# Patient Record
Sex: Female | Born: 1980 | Hispanic: No | Marital: Married | Smoking: Never smoker
Health system: Southern US, Community
[De-identification: ages and names within clinical notes are randomized; demographics above are authoritative.]

## PROBLEM LIST (undated history)

## (undated) DIAGNOSIS — Z5189 Encounter for other specified aftercare: Secondary | ICD-10-CM

## (undated) DIAGNOSIS — Z8759 Personal history of other complications of pregnancy, childbirth and the puerperium: Secondary | ICD-10-CM

## (undated) HISTORY — DX: Encounter for other specified aftercare: Z51.89

## (undated) HISTORY — DX: Personal history of other complications of pregnancy, childbirth and the puerperium: Z87.59

---

## 2016-08-16 DIAGNOSIS — N898 Other specified noninflammatory disorders of vagina: Secondary | ICD-10-CM | POA: Diagnosis not present

## 2016-08-16 DIAGNOSIS — B373 Candidiasis of vulva and vagina: Secondary | ICD-10-CM | POA: Diagnosis not present

## 2016-08-16 DIAGNOSIS — N920 Excessive and frequent menstruation with regular cycle: Secondary | ICD-10-CM | POA: Diagnosis not present

## 2017-02-08 DIAGNOSIS — Z6821 Body mass index (BMI) 21.0-21.9, adult: Secondary | ICD-10-CM | POA: Diagnosis not present

## 2017-02-08 DIAGNOSIS — Z130 Encounter for screening for diseases of the blood and blood-forming organs and certain disorders involving the immune mechanism: Secondary | ICD-10-CM | POA: Diagnosis not present

## 2017-02-08 DIAGNOSIS — Z1329 Encounter for screening for other suspected endocrine disorder: Secondary | ICD-10-CM | POA: Diagnosis not present

## 2017-02-08 DIAGNOSIS — Z0000 Encounter for general adult medical examination without abnormal findings: Secondary | ICD-10-CM | POA: Diagnosis not present

## 2017-02-08 DIAGNOSIS — Z1151 Encounter for screening for human papillomavirus (HPV): Secondary | ICD-10-CM | POA: Diagnosis not present

## 2017-02-08 DIAGNOSIS — Z13220 Encounter for screening for lipoid disorders: Secondary | ICD-10-CM | POA: Diagnosis not present

## 2017-02-08 DIAGNOSIS — Z01419 Encounter for gynecological examination (general) (routine) without abnormal findings: Secondary | ICD-10-CM | POA: Diagnosis not present

## 2017-02-16 DIAGNOSIS — R1013 Epigastric pain: Secondary | ICD-10-CM | POA: Diagnosis not present

## 2017-02-21 DIAGNOSIS — R1013 Epigastric pain: Secondary | ICD-10-CM | POA: Diagnosis not present

## 2017-02-28 ENCOUNTER — Other Ambulatory Visit (HOSPITAL_COMMUNITY): Payer: Self-pay | Admitting: Gastroenterology

## 2017-02-28 DIAGNOSIS — R1013 Epigastric pain: Secondary | ICD-10-CM

## 2017-03-07 DIAGNOSIS — Z30430 Encounter for insertion of intrauterine contraceptive device: Secondary | ICD-10-CM | POA: Diagnosis not present

## 2017-03-14 ENCOUNTER — Ambulatory Visit (HOSPITAL_COMMUNITY)
Admission: RE | Admit: 2017-03-14 | Discharge: 2017-03-14 | Disposition: A | Discharge status: Home / Self Care | Payer: BLUE CROSS/BLUE SHIELD | Source: Ambulatory Visit | Attending: Gastroenterology | Admitting: Gastroenterology

## 2017-03-14 DIAGNOSIS — R1013 Epigastric pain: Secondary | ICD-10-CM

## 2017-03-14 DIAGNOSIS — R1011 Right upper quadrant pain: Secondary | ICD-10-CM | POA: Diagnosis not present

## 2017-03-14 MED ORDER — TECHNETIUM TC 99M MEBROFENIN IV KIT
5.0000 | PACK | Freq: Once | INTRAVENOUS | Status: AC | PRN
  Administered 2017-03-14: 5 via INTRAVENOUS

## 2017-05-02 DIAGNOSIS — N920 Excessive and frequent menstruation with regular cycle: Secondary | ICD-10-CM | POA: Diagnosis not present

## 2017-05-02 DIAGNOSIS — R1031 Right lower quadrant pain: Secondary | ICD-10-CM | POA: Diagnosis not present

## 2017-05-02 DIAGNOSIS — Z30431 Encounter for routine checking of intrauterine contraceptive device: Secondary | ICD-10-CM | POA: Diagnosis not present

## 2018-02-13 DIAGNOSIS — Z01419 Encounter for gynecological examination (general) (routine) without abnormal findings: Secondary | ICD-10-CM | POA: Diagnosis not present

## 2018-02-13 DIAGNOSIS — Z6822 Body mass index (BMI) 22.0-22.9, adult: Secondary | ICD-10-CM | POA: Diagnosis not present

## 2018-02-13 DIAGNOSIS — Z130 Encounter for screening for diseases of the blood and blood-forming organs and certain disorders involving the immune mechanism: Secondary | ICD-10-CM | POA: Diagnosis not present

## 2018-06-14 DIAGNOSIS — H16203 Unspecified keratoconjunctivitis, bilateral: Secondary | ICD-10-CM | POA: Diagnosis not present

## 2018-07-16 DIAGNOSIS — H16203 Unspecified keratoconjunctivitis, bilateral: Secondary | ICD-10-CM | POA: Diagnosis not present

## 2018-10-17 DIAGNOSIS — H16203 Unspecified keratoconjunctivitis, bilateral: Secondary | ICD-10-CM | POA: Diagnosis not present

## 2019-04-03 IMAGING — NM NM HEPATO W/GB/PHARM/[PERSON_NAME]
3 series · 13 of 13 positions shown · non-contrast
Comparison: None.

CLINICAL DATA: Right upper quadrant pain and bloating for 1 month

EXAM:
NUCLEAR MEDICINE HEPATOBILIARY IMAGING WITH GALLBLADDER EF
TECHNIQUE: Sequential images of the abdomen were obtained [DATE] minutes
following intravenous administration of radiopharmaceutical. After
oral ingestion of Ensure, gallbladder ejection fraction was
determined. At 60 min, normal ejection fraction is greater than 33%.
RADIOPHARMACEUTICALS:  5.15 mCi Kc-JJm  Choletec IV

[he hepatobiliary · 3.43mm/px · 6 of 60 frames shown (1 of 2)]
[frame 6/60]
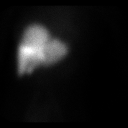
[frame 16/60]
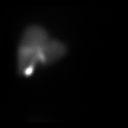
[frame 26/60]
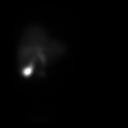
[frame 36/60]
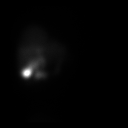
[frame 46/60]
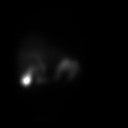
[frame 56/60]
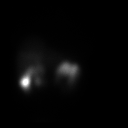

[he hepatobiliary · 3.43mm/px · 6 of 60 frames shown (2 of 2)]
[frame 6/60]
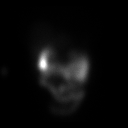
[frame 16/60]
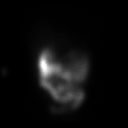
[frame 26/60]
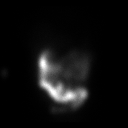
[frame 36/60]
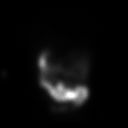
[frame 46/60]
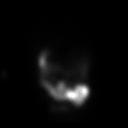
[frame 56/60]
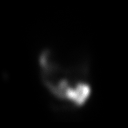

[st static image · 1.72mm/px · 1 of 1 slices shown]
[im 1/1]
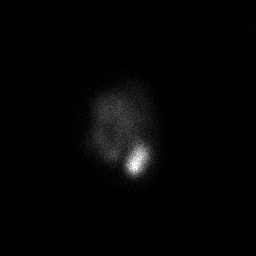

[13 of 13 positions shown; findings below may reference images not displayed]

FINDINGS: Prompt uptake and biliary excretion of activity by the liver is
seen. Gallbladder activity is visualized, consistent with patency of
cystic duct. Biliary activity passes into small bowel, consistent
with patent common bile duct.

Calculated gallbladder ejection fraction is 64%. (Normal gallbladder
ejection fraction with Ensure is greater than 33%.)
IMPRESSION: Normal uptake and excretion of biliary tracer.

Normal gallbladder ejection fraction.

## 2019-10-31 DIAGNOSIS — Z01419 Encounter for gynecological examination (general) (routine) without abnormal findings: Secondary | ICD-10-CM | POA: Diagnosis not present

## 2019-10-31 DIAGNOSIS — Z1151 Encounter for screening for human papillomavirus (HPV): Secondary | ICD-10-CM | POA: Diagnosis not present

## 2019-10-31 DIAGNOSIS — Z30432 Encounter for removal of intrauterine contraceptive device: Secondary | ICD-10-CM | POA: Diagnosis not present

## 2019-10-31 DIAGNOSIS — Z6823 Body mass index (BMI) 23.0-23.9, adult: Secondary | ICD-10-CM | POA: Diagnosis not present

## 2019-11-17 ENCOUNTER — Ambulatory Visit: Payer: BC Managed Care – PPO | Attending: Internal Medicine

## 2019-12-10 DIAGNOSIS — Z131 Encounter for screening for diabetes mellitus: Secondary | ICD-10-CM | POA: Diagnosis not present

## 2019-12-10 DIAGNOSIS — Z1329 Encounter for screening for other suspected endocrine disorder: Secondary | ICD-10-CM | POA: Diagnosis not present

## 2019-12-10 DIAGNOSIS — Z13220 Encounter for screening for lipoid disorders: Secondary | ICD-10-CM | POA: Diagnosis not present

## 2019-12-10 DIAGNOSIS — Z1321 Encounter for screening for nutritional disorder: Secondary | ICD-10-CM | POA: Diagnosis not present

## 2019-12-10 DIAGNOSIS — Z0000 Encounter for general adult medical examination without abnormal findings: Secondary | ICD-10-CM | POA: Diagnosis not present

## 2020-01-08 DIAGNOSIS — R7401 Elevation of levels of liver transaminase levels: Secondary | ICD-10-CM | POA: Diagnosis not present

## 2020-01-08 DIAGNOSIS — N926 Irregular menstruation, unspecified: Secondary | ICD-10-CM | POA: Diagnosis not present

## 2020-01-16 DIAGNOSIS — Z3200 Encounter for pregnancy test, result unknown: Secondary | ICD-10-CM | POA: Diagnosis not present

## 2020-01-16 DIAGNOSIS — N911 Secondary amenorrhea: Secondary | ICD-10-CM | POA: Diagnosis not present

## 2020-02-13 DIAGNOSIS — N911 Secondary amenorrhea: Secondary | ICD-10-CM | POA: Diagnosis not present

## 2020-03-16 DIAGNOSIS — R945 Abnormal results of liver function studies: Secondary | ICD-10-CM | POA: Diagnosis not present

## 2020-03-24 DIAGNOSIS — N911 Secondary amenorrhea: Secondary | ICD-10-CM | POA: Diagnosis not present

## 2020-08-11 LAB — OB RESULTS CONSOLE GC/CHLAMYDIA
Chlamydia: NEGATIVE
Gonorrhea: NEGATIVE

## 2020-08-11 LAB — OB RESULTS CONSOLE RUBELLA ANTIBODY, IGM: Rubella: IMMUNE

## 2020-08-11 LAB — OB RESULTS CONSOLE RPR: RPR: NONREACTIVE

## 2020-08-11 LAB — OB RESULTS CONSOLE HEPATITIS B SURFACE ANTIGEN: Hepatitis B Surface Ag: NEGATIVE

## 2020-08-11 LAB — OB RESULTS CONSOLE HIV ANTIBODY (ROUTINE TESTING): HIV: NONREACTIVE

## 2021-01-01 ENCOUNTER — Other Ambulatory Visit: Payer: Self-pay

## 2021-01-01 ENCOUNTER — Inpatient Hospital Stay (HOSPITAL_COMMUNITY)
Admission: AD | Admit: 2021-01-01 | Discharge: 2021-01-01 | Disposition: A | Discharge status: Home / Self Care | Payer: PRIVATE HEALTH INSURANCE | Attending: Obstetrics | Admitting: Obstetrics

## 2021-01-01 ENCOUNTER — Inpatient Hospital Stay (HOSPITAL_BASED_OUTPATIENT_CLINIC_OR_DEPARTMENT_OTHER): Payer: PRIVATE HEALTH INSURANCE

## 2021-01-01 ENCOUNTER — Encounter (HOSPITAL_COMMUNITY): Payer: Self-pay | Admitting: Obstetrics

## 2021-01-01 DIAGNOSIS — R109 Unspecified abdominal pain: Secondary | ICD-10-CM | POA: Diagnosis not present

## 2021-01-01 DIAGNOSIS — O99891 Other specified diseases and conditions complicating pregnancy: Secondary | ICD-10-CM | POA: Diagnosis not present

## 2021-01-01 DIAGNOSIS — Z3689 Encounter for other specified antenatal screening: Secondary | ICD-10-CM | POA: Diagnosis not present

## 2021-01-01 DIAGNOSIS — O26893 Other specified pregnancy related conditions, third trimester: Secondary | ICD-10-CM | POA: Diagnosis present

## 2021-01-01 DIAGNOSIS — Z3A30 30 weeks gestation of pregnancy: Secondary | ICD-10-CM | POA: Diagnosis not present
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
## 2021-01-02 DIAGNOSIS — T1490XA Injury, unspecified, initial encounter: Secondary | ICD-10-CM

## 2021-01-02 DIAGNOSIS — Z3A30 30 weeks gestation of pregnancy: Secondary | ICD-10-CM | POA: Diagnosis not present

## 2021-01-02 DIAGNOSIS — O9A213 Injury, poisoning and certain other consequences of external causes complicating pregnancy, third trimester: Secondary | ICD-10-CM | POA: Diagnosis not present

## 2021-02-09 LAB — OB RESULTS CONSOLE GBS: GBS: NEGATIVE

## 2021-02-12 ENCOUNTER — Telehealth (HOSPITAL_COMMUNITY): Payer: Self-pay | Admitting: *Deleted

## 2021-02-12 NOTE — Telephone Encounter (Signed)
Preadmission screen  

## 2021-02-15 ENCOUNTER — Telehealth (HOSPITAL_COMMUNITY): Payer: Self-pay | Admitting: *Deleted

## 2021-02-15 NOTE — Telephone Encounter (Signed)
Preadmission screen  

## 2021-02-17 ENCOUNTER — Telehealth (HOSPITAL_COMMUNITY): Payer: Self-pay | Admitting: *Deleted

## 2021-02-17 ENCOUNTER — Encounter (HOSPITAL_COMMUNITY): Payer: Self-pay | Admitting: *Deleted

## 2021-02-17 NOTE — Telephone Encounter (Signed)
Preadmission screen  

## 2021-02-25 ENCOUNTER — Other Ambulatory Visit (HOSPITAL_COMMUNITY)
Admission: RE | Admit: 2021-02-25 | Discharge: 2021-02-25 | Disposition: A | Discharge status: Home / Self Care | Payer: No Typology Code available for payment source | Source: Ambulatory Visit | Attending: Obstetrics & Gynecology | Admitting: Obstetrics & Gynecology

## 2021-02-25 ENCOUNTER — Other Ambulatory Visit: Payer: Self-pay | Admitting: Obstetrics & Gynecology

## 2021-02-25 DIAGNOSIS — Z01812 Encounter for preprocedural laboratory examination: Secondary | ICD-10-CM | POA: Insufficient documentation

## 2021-02-25 DIAGNOSIS — Z20822 Contact with and (suspected) exposure to covid-19: Secondary | ICD-10-CM | POA: Insufficient documentation

## 2021-02-26 LAB — SARS CORONAVIRUS 2 (TAT 6-24 HRS): SARS Coronavirus 2: NEGATIVE

## 2021-02-27 ENCOUNTER — Encounter (HOSPITAL_COMMUNITY)
Admission: AD | Disposition: A | Discharge status: Home / Self Care | Payer: Self-pay | Source: Home / Self Care | Attending: Obstetrics & Gynecology

## 2021-02-27 ENCOUNTER — Inpatient Hospital Stay (HOSPITAL_COMMUNITY): Payer: No Typology Code available for payment source | Admitting: Anesthesiology

## 2021-02-27 ENCOUNTER — Encounter (HOSPITAL_COMMUNITY): Payer: Self-pay | Admitting: Obstetrics & Gynecology

## 2021-02-27 ENCOUNTER — Inpatient Hospital Stay (HOSPITAL_COMMUNITY)
Admission: AD | Admit: 2021-02-27 | Discharge: 2021-03-01 | DRG: 787 | Disposition: A | Discharge status: Home / Self Care | Payer: No Typology Code available for payment source | Attending: Obstetrics & Gynecology | Admitting: Obstetrics & Gynecology

## 2021-02-27 ENCOUNTER — Inpatient Hospital Stay (HOSPITAL_COMMUNITY): Payer: No Typology Code available for payment source

## 2021-02-27 ENCOUNTER — Other Ambulatory Visit: Payer: Self-pay

## 2021-02-27 DIAGNOSIS — O24420 Gestational diabetes mellitus in childbirth, diet controlled: Principal | ICD-10-CM | POA: Diagnosis present

## 2021-02-27 DIAGNOSIS — O9902 Anemia complicating childbirth: Secondary | ICD-10-CM | POA: Diagnosis present

## 2021-02-27 DIAGNOSIS — O9912 Other diseases of the blood and blood-forming organs and certain disorders involving the immune mechanism complicating childbirth: Secondary | ICD-10-CM | POA: Diagnosis present

## 2021-02-27 DIAGNOSIS — Z3A39 39 weeks gestation of pregnancy: Secondary | ICD-10-CM

## 2021-02-27 DIAGNOSIS — Z20822 Contact with and (suspected) exposure to covid-19: Secondary | ICD-10-CM | POA: Diagnosis present

## 2021-02-27 DIAGNOSIS — D6959 Other secondary thrombocytopenia: Secondary | ICD-10-CM | POA: Diagnosis present

## 2021-02-27 DIAGNOSIS — Z3490 Encounter for supervision of normal pregnancy, unspecified, unspecified trimester: Secondary | ICD-10-CM | POA: Diagnosis present

## 2021-02-27 DIAGNOSIS — D649 Anemia, unspecified: Secondary | ICD-10-CM

## 2021-02-27 DIAGNOSIS — O324XX0 Maternal care for high head at term, not applicable or unspecified: Secondary | ICD-10-CM | POA: Diagnosis present

## 2021-02-27 DIAGNOSIS — O24410 Gestational diabetes mellitus in pregnancy, diet controlled: Secondary | ICD-10-CM | POA: Diagnosis present

## 2021-02-27 DIAGNOSIS — O99892 Other specified diseases and conditions complicating childbirth: Secondary | ICD-10-CM | POA: Diagnosis present

## 2021-02-27 DIAGNOSIS — O322XX0 Maternal care for transverse and oblique lie, not applicable or unspecified: Secondary | ICD-10-CM | POA: Diagnosis present

## 2021-02-27 DIAGNOSIS — O99119 Other diseases of the blood and blood-forming organs and certain disorders involving the immune mechanism complicating pregnancy, unspecified trimester: Secondary | ICD-10-CM | POA: Diagnosis present

## 2021-02-27 DIAGNOSIS — D696 Thrombocytopenia, unspecified: Secondary | ICD-10-CM | POA: Diagnosis present

## 2021-02-27 LAB — CBC
HCT: 40.4 % (ref 36.0–46.0)
Hemoglobin: 13.2 g/dL (ref 12.0–15.0)
MCH: 28.3 pg (ref 26.0–34.0)
MCHC: 32.7 g/dL (ref 30.0–36.0)
MCV: 86.7 fL (ref 80.0–100.0)
Platelets: 129 10*3/uL — ABNORMAL LOW (ref 150–400)
RBC: 4.66 MIL/uL (ref 3.87–5.11)
RDW: 13.4 % (ref 11.5–15.5)
WBC: 8.2 10*3/uL (ref 4.0–10.5)
nRBC: 0 % (ref 0.0–0.2)

## 2021-02-27 LAB — GLUCOSE, CAPILLARY
Glucose-Capillary: 124 mg/dL — ABNORMAL HIGH (ref 70–99)
Glucose-Capillary: 114 mg/dL — ABNORMAL HIGH (ref 70–99)
Glucose-Capillary: 96 mg/dL (ref 70–99)
Glucose-Capillary: 91 mg/dL (ref 70–99)
Glucose-Capillary: 97 mg/dL (ref 70–99)

## 2021-02-27 LAB — TYPE AND SCREEN
ABO/RH(D): A POS
Antibody Screen: NEGATIVE

## 2021-02-27 LAB — RPR: RPR Ser Ql: NONREACTIVE

## 2021-02-27 SURGERY — Surgical Case
Anesthesia: Epidural

## 2021-02-27 MED ORDER — MISOPROSTOL 25 MCG QUARTER TABLET: 25.0000 ug | ORAL_TABLET | Freq: Once | ORAL | Status: DC

## 2021-02-27 MED ORDER — SOD CITRATE-CITRIC ACID 500-334 MG/5ML PO SOLN
30.0000 mL | ORAL | Status: DC | PRN
  Administered 2021-02-27: 30 mL via ORAL
  Filled 2021-02-27: qty 15

## 2021-02-27 MED ORDER — LACTATED RINGERS IV SOLN: INTRAVENOUS | Status: DC

## 2021-02-27 MED ORDER — CEFAZOLIN SODIUM-DEXTROSE 2-4 GM/100ML-% IV SOLN
2.0000 g | INTRAVENOUS | Status: AC
  Administered 2021-02-27: 2 g via INTRAVENOUS

## 2021-02-27 MED ORDER — FENTANYL CITRATE (PF) 100 MCG/2ML IJ SOLN
INTRAMUSCULAR | Status: AC
  Filled 2021-02-27: qty 2

## 2021-02-27 MED ORDER — PHENYLEPHRINE HCL (PRESSORS) 10 MG/ML IV SOLN
INTRAVENOUS | Status: DC | PRN
  Administered 2021-02-27: 120 ug via INTRAVENOUS
  Administered 2021-02-27 (×2): 80 ug via INTRAVENOUS

## 2021-02-27 MED ORDER — POVIDONE-IODINE 10 % EX SWAB: 2.0000 "application " | Freq: Once | CUTANEOUS | Status: DC

## 2021-02-27 MED ORDER — OXYTOCIN-SODIUM CHLORIDE 30-0.9 UT/500ML-% IV SOLN: 2.5000 [IU]/h | INTRAVENOUS | Status: DC

## 2021-02-27 MED ORDER — DIPHENHYDRAMINE HCL 50 MG/ML IJ SOLN: 12.5000 mg | INTRAMUSCULAR | Status: DC | PRN

## 2021-02-27 MED ORDER — CEFAZOLIN SODIUM-DEXTROSE 2-4 GM/100ML-% IV SOLN
INTRAVENOUS | Status: AC
  Filled 2021-02-27: qty 100

## 2021-02-27 MED ORDER — ONDANSETRON HCL 4 MG/2ML IJ SOLN: 4.0000 mg | Freq: Four times a day (QID) | INTRAMUSCULAR | Status: DC | PRN

## 2021-02-27 MED ORDER — LACTATED RINGERS IV SOLN
500.0000 mL | INTRAVENOUS | Status: DC | PRN
  Administered 2021-02-27: 500 mL via INTRAVENOUS

## 2021-02-27 MED ORDER — DEXAMETHASONE SODIUM PHOSPHATE 4 MG/ML IJ SOLN
INTRAMUSCULAR | Status: DC | PRN
  Administered 2021-02-27: 4 mg via INTRAVENOUS

## 2021-02-27 MED ORDER — ONDANSETRON HCL 4 MG/2ML IJ SOLN
INTRAMUSCULAR | Status: AC
  Filled 2021-02-27: qty 2

## 2021-02-27 MED ORDER — EPHEDRINE 5 MG/ML INJ: 10.0000 mg | INTRAVENOUS | Status: DC | PRN

## 2021-02-27 MED ORDER — EPHEDRINE 5 MG/ML INJ
10.0000 mg | INTRAVENOUS | Status: DC | PRN
  Administered 2021-02-27: 10 mg via INTRAVENOUS
  Filled 2021-02-27: qty 10

## 2021-02-27 MED ORDER — MORPHINE SULFATE (PF) 0.5 MG/ML IJ SOLN
INTRAMUSCULAR | Status: AC
  Filled 2021-02-27: qty 10

## 2021-02-27 MED ORDER — OXYTOCIN-SODIUM CHLORIDE 30-0.9 UT/500ML-% IV SOLN
1.0000 m[IU]/min | INTRAVENOUS | Status: DC
  Administered 2021-02-27: 2 m[IU]/min via INTRAVENOUS
  Filled 2021-02-27: qty 500

## 2021-02-27 MED ORDER — MISOPROSTOL 50MCG HALF TABLET
50.0000 ug | ORAL_TABLET | Freq: Once | ORAL | Status: AC
  Administered 2021-02-27: 50 ug via BUCCAL
  Filled 2021-02-27: qty 1

## 2021-02-27 MED ORDER — FENTANYL CITRATE (PF) 100 MCG/2ML IJ SOLN
INTRAMUSCULAR | Status: DC | PRN
  Administered 2021-02-27: 100 ug via EPIDURAL

## 2021-02-27 MED ORDER — ONDANSETRON HCL 4 MG/2ML IJ SOLN
INTRAMUSCULAR | Status: DC | PRN
  Administered 2021-02-27: 4 mg via INTRAVENOUS

## 2021-02-27 MED ORDER — OXYTOCIN BOLUS FROM INFUSION: 333.0000 mL | Freq: Once | INTRAVENOUS | Status: DC

## 2021-02-27 MED ORDER — PHENYLEPHRINE 40 MCG/ML (10ML) SYRINGE FOR IV PUSH (FOR BLOOD PRESSURE SUPPORT)
80.0000 ug | PREFILLED_SYRINGE | INTRAVENOUS | Status: DC | PRN
  Filled 2021-02-27: qty 10

## 2021-02-27 MED ORDER — MEPERIDINE HCL 25 MG/ML IJ SOLN
INTRAMUSCULAR | Status: AC
  Filled 2021-02-27: qty 1

## 2021-02-27 MED ORDER — LACTATED RINGERS IV SOLN
500.0000 mL | Freq: Once | INTRAVENOUS | Status: AC
  Administered 2021-02-27: 500 mL via INTRAVENOUS

## 2021-02-27 MED ORDER — ACETAMINOPHEN 325 MG PO TABS: 650.0000 mg | ORAL_TABLET | ORAL | Status: DC | PRN

## 2021-02-27 MED ORDER — FENTANYL-BUPIVACAINE-NACL 0.5-0.125-0.9 MG/250ML-% EP SOLN
12.0000 mL/h | EPIDURAL | Status: DC | PRN
Start: 2021-02-27 — End: 2021-02-28
  Administered 2021-02-27: 12 mL/h via EPIDURAL
  Filled 2021-02-27: qty 250

## 2021-02-27 MED ORDER — PHENYLEPHRINE 40 MCG/ML (10ML) SYRINGE FOR IV PUSH (FOR BLOOD PRESSURE SUPPORT): 80.0000 ug | PREFILLED_SYRINGE | INTRAVENOUS | Status: DC | PRN

## 2021-02-27 MED ORDER — TERBUTALINE SULFATE 1 MG/ML IJ SOLN: 0.2500 mg | Freq: Once | INTRAMUSCULAR | Status: DC | PRN

## 2021-02-27 MED ORDER — DEXAMETHASONE SODIUM PHOSPHATE 4 MG/ML IJ SOLN
INTRAMUSCULAR | Status: AC
  Filled 2021-02-27: qty 1

## 2021-02-27 MED ORDER — LIDOCAINE-EPINEPHRINE (PF) 2 %-1:200000 IJ SOLN
INTRAMUSCULAR | Status: DC | PRN
  Administered 2021-02-27: 10 mL via EPIDURAL
  Administered 2021-02-27: 4 mL via EPIDURAL

## 2021-02-27 MED ORDER — LIDOCAINE HCL (PF) 1 % IJ SOLN: 30.0000 mL | INTRAMUSCULAR | Status: DC | PRN

## 2021-02-27 MED ORDER — OXYTOCIN-SODIUM CHLORIDE 30-0.9 UT/500ML-% IV SOLN
INTRAVENOUS | Status: AC
  Filled 2021-02-27: qty 500

## 2021-02-27 MED ORDER — OXYTOCIN-SODIUM CHLORIDE 30-0.9 UT/500ML-% IV SOLN
INTRAVENOUS | Status: DC | PRN
  Administered 2021-02-27 – 2021-02-28 (×2): 30 [IU] via INTRAVENOUS

## 2021-02-27 SURGICAL SUPPLY — 34 items
BENZOIN TINCTURE PRP APPL 2/3 (GAUZE/BANDAGES/DRESSINGS) ×2 IMPLANT
CHLORAPREP W/TINT 26ML (MISCELLANEOUS) ×2 IMPLANT
CLAMP CORD UMBIL (MISCELLANEOUS) IMPLANT
CLOTH BEACON ORANGE TIMEOUT ST (SAFETY) ×2 IMPLANT
DRSG OPSITE POSTOP 4X10 (GAUZE/BANDAGES/DRESSINGS) ×2 IMPLANT
ELECT REM PT RETURN 9FT ADLT (ELECTROSURGICAL) ×2
ELECTRODE REM PT RTRN 9FT ADLT (ELECTROSURGICAL) ×1 IMPLANT
EXTRACTOR VACUUM KIWI (MISCELLANEOUS) IMPLANT
EXTRACTOR VACUUM M CUP 4 TUBE (SUCTIONS) IMPLANT
GLOVE BIO SURGEON STRL SZ7 (GLOVE) ×2 IMPLANT
GLOVE BIOGEL PI IND STRL 7.0 (GLOVE) ×2 IMPLANT
GLOVE BIOGEL PI INDICATOR 7.0 (GLOVE) ×2
GOWN STRL REUS W/TWL LRG LVL3 (GOWN DISPOSABLE) ×4 IMPLANT
KIT ABG SYR 3ML LUER SLIP (SYRINGE) IMPLANT
NEEDLE HYPO 25X5/8 SAFETYGLIDE (NEEDLE) IMPLANT
NS IRRIG 1000ML POUR BTL (IV SOLUTION) ×2 IMPLANT
PACK C SECTION WH (CUSTOM PROCEDURE TRAY) ×2 IMPLANT
PAD OB MATERNITY 4.3X12.25 (PERSONAL CARE ITEMS) ×2 IMPLANT
RTRCTR C-SECT PINK 25CM LRG (MISCELLANEOUS) IMPLANT
STRIP CLOSURE SKIN 1/2X4 (GAUZE/BANDAGES/DRESSINGS) ×2 IMPLANT
SUT MNCRL 0 VIOLET CTX 36 (SUTURE) ×2 IMPLANT
SUT MONOCRYL 0 CTX 36 (SUTURE) ×2
SUT PLAIN 0 NONE (SUTURE) IMPLANT
SUT PLAIN 2 0 (SUTURE)
SUT PLAIN ABS 2-0 CT1 27XMFL (SUTURE) IMPLANT
SUT VIC AB 0 CT1 27 (SUTURE) ×2
SUT VIC AB 0 CT1 27XBRD ANBCTR (SUTURE) ×2 IMPLANT
SUT VIC AB 2-0 CT1 27 (SUTURE) ×1
SUT VIC AB 2-0 CT1 TAPERPNT 27 (SUTURE) ×1 IMPLANT
SUT VIC AB 4-0 KS 27 (SUTURE) ×2 IMPLANT
SUT VICRYL 0 TIES 12 18 (SUTURE) IMPLANT
TOWEL OR 17X24 6PK STRL BLUE (TOWEL DISPOSABLE) ×2 IMPLANT
TRAY FOLEY W/BAG SLVR 14FR LF (SET/KITS/TRAYS/PACK) IMPLANT
WATER STERILE IRR 1000ML POUR (IV SOLUTION) ×2 IMPLANT

## 2021-02-27 NOTE — Progress Notes (Signed)
Pt assessed.  BP 110/74   Pulse 93   Temp 98 F (36.7 C) (Oral)   Resp 18   Ht 5\' 1"  (1.549 m)   Wt 65 kg   SpO2 100%   BMI 27.08 kg/m  FHT cat I Toco q 2-3 min  Foley expelled. Cx 3- 4 cm, stretchy. No presenting part at inlet. Abdominal exam c/w head in RLQ c/w oblique lie Stop pitocin. Proceed with C-section per maternal request, declines attempt to vert to longitudinal and try.   Stop induction.  Proceed with 1' C/section for arrest of descent and now oblique lie. Declines tubal sterilization.  Risks/complications of surgery reviewed incl infection, bleeding, damage to internal organs including bladder, bowels, ureters, blood vessels, other risks from anesthesia, VTE and delayed complications of any surgery, complications in future surgery reviewed. Also discussed neonatal complications incl difficult delivery, laceration, vacuum assistance, TTN etc. Pt understands and agrees, all concerns addressed.    V.Delilah Mulgrew MD

## 2021-02-27 NOTE — Progress Notes (Signed)
Andrea [name] is a 40 y.o. G2P1000 at [redacted]w[redacted]d by ultrasound admitted for induction of labor due to [facility] at age 48. .  Subjective: Suprapubic and LLQ pain, quite intense, epidural not helping in that area  Objective: BP 140/76   Pulse 75   Temp 98 F (36.7 C) (Oral)   Resp 18   Ht 5\' 1"  (1.549 m)   Wt 65 kg   SpO2 100%   BMI 27.08 kg/m  I/O last 3 completed shifts: In: -  Out: 750 [Urine:750] No intake/output data recorded.  FHT:  FHR: 150 bpm, variability: moderate,  accelerations:  Present,  decelerations:  Absent UC:   regular, every 2 minutes, pitocin dropped to 1 mu  SVE:   Dilation: 2 Effacement (%): 50 Station: -3 Exam by:: Dr. Benjie Karvonen Head not applied to cervix. High unengaged ballotable head are concerning Cook's cervical balloon placed with 60 cc fluid    Assessment / Plan: IOL 39 wks, G2P1001. Pt not happy with pain control and non- progress and is requesting C-section. Doesnot plan to have more kids, this was difficult conception and she rather have a C-section. Counseled on risks. complications. Understands. Advised to try exaggerated Sims and reassess in 2 hrs. Agrees to allow me to reassess in 2 hrs and if no progress, then wants C-section   Andrea [name] 02/27/2021, 8:26 PM

## 2021-02-27 NOTE — Anesthesia Procedure Notes (Signed)
Epidural Patient location during procedure: OB Start time: 02/27/2021 1:40 PM End time: 02/27/2021 1:50 PM  Staffing Anesthesiologist: Freddrick March, MD Performed: anesthesiologist   Preanesthetic Checklist Completed: patient identified, IV checked, risks and benefits discussed, monitors and equipment checked, pre-op evaluation and timeout performed  Epidural Patient position: sitting Prep: DuraPrep and site prepped and draped Patient monitoring: continuous pulse ox, blood pressure, heart rate and cardiac monitor Approach: midline Location: L3-L4 Injection technique: LOR air  Needle:  Needle type: Tuohy  Needle gauge: 17 G Needle length: 9 cm Needle insertion depth: 4 cm Catheter type: closed end flexible Catheter size: 19 Gauge Catheter at skin depth: 10 cm Test dose: negative  Assessment Sensory level: T8 Events: blood not aspirated, injection not painful, no injection resistance, no paresthesia and negative IV test  Additional Notes Patient identified. Risks/Benefits/Options discussed with patient including but not limited to bleeding, infection, nerve damage, paralysis, failed block, incomplete pain control, headache, blood pressure changes, nausea, vomiting, reactions to medication both or allergic, itching and postpartum back pain. Confirmed with bedside nurse the patient's most recent platelet count. Confirmed with patient that they are not currently taking any anticoagulation, have any bleeding history or any family history of bleeding disorders. Patient expressed understanding and wished to proceed. All questions were answered. Sterile technique was used throughout the entire procedure. Please see nursing notes for vital signs. Test dose was given through epidural catheter and negative prior to continuing to dose epidural or start infusion. Warning signs of high block given to the patient including shortness of breath, tingling/numbness in hands, complete motor block,  or any concerning symptoms with instructions to call for help. Patient was given instructions on fall risk and not to get out of bed. All questions and concerns addressed with instructions to call with any issues or inadequate analgesia.  Reason for block:procedure for pain

## 2021-02-27 NOTE — Anesthesia Preprocedure Evaluation (Signed)
Anesthesia Evaluation  Patient identified by MRN, date of birth, ID band Patient awake    Reviewed: Allergy & Precautions, NPO status , Patient's Chart, lab work & pertinent test results  Airway Mallampati: II  TM Distance: >3 FB Neck ROM: Full    Dental no notable dental hx.    Pulmonary neg pulmonary ROS,    Pulmonary exam normal breath sounds clear to auscultation       Cardiovascular negative cardio ROS Normal cardiovascular exam Rhythm:Regular Rate:Normal     Neuro/Psych negative neurological ROS  negative psych ROS   GI/Hepatic negative GI ROS, Neg liver ROS,   Endo/Other  diabetes, Gestational  Renal/GU negative Renal ROS  negative genitourinary   Musculoskeletal negative musculoskeletal ROS (+)   Abdominal   Peds  Hematology negative hematology ROS (+)   Anesthesia Other Findings IOL for gDM  Reproductive/Obstetrics (+) Pregnancy                             Anesthesia Physical Anesthesia Plan  ASA: III  Anesthesia Plan: Epidural   Post-op Pain Management:    Induction:   PONV Risk Score and Plan: Treatment may vary due to age or medical condition  Airway Management Planned: Natural Airway  Additional Equipment:   Intra-op Plan:   Post-operative Plan:   Informed Consent: I have reviewed the patients History and Physical, chart, labs and discussed the procedure including the risks, benefits and alternatives for the proposed anesthesia with the patient or authorized representative who has indicated his/her understanding and acceptance.       Plan Discussed with: Anesthesiologist  Anesthesia Plan Comments: (Patient identified. Risks, benefits, options discussed with patient including but not limited to bleeding, infection, nerve damage, paralysis, failed block, incomplete pain control, headache, blood pressure changes, nausea, vomiting, reactions to medication,  itching, and post partum back pain. Confirmed with bedside nurse the patient's most recent platelet count. Confirmed with the patient that they are not taking any anticoagulation, have any bleeding history or any family history of bleeding disorders. Patient expressed understanding and wishes to proceed. All questions were answered. )        Anesthesia Quick Evaluation

## 2021-02-27 NOTE — H&P (Signed)
Andrea [name] is a 40 y.o. female G2P1001, 39 wks, IOL for AMA,A1GDM AMA, Low AMH, this is [facility] conception.  Nl PNLabs Anemia, taking Iron. NIPS nl (XX) , CUS nl. warning s.s discussed, COmplete Covid booster. A1GDM, 32 wk 4'11" 57% AC 75% vx                 36 wks 5'14" 25% AC AGA   G1- SVD, Girl, 2012, 6.7 lbs. vaginal hematoma, ICU admission needed blood transfusion- [place].  OB History     Gravida  2   Para  1   Term  1   Preterm      AB      Living         SAB      IAB      Ectopic      Multiple      Live Births             Past Medical History:  Diagnosis Date   Blood transfusion without reported diagnosis    History of postpartum hemorrhage    No past surgical history on file. Family History: family history includes Diabetes in her mother; Glaucoma in her mother; Hypertension in her mother; Thyroid disease in her mother. Social History:  reports that she has never smoked. She has never used smokeless tobacco. She reports previous alcohol use. She reports that she does not use drugs.     Maternal Diabetes: Yes:  Diabetes Type:  Diet controlled Genetic Screening: Normal NIPT Maternal Ultrasounds/Referrals: Normal Fetal Ultrasounds or other Referrals:  None Maternal Substance Abuse:  No Significant Maternal Medications:  None Significant Maternal Lab Results:  Group B Strep negative Other Comments:  None  Review of Systems History Dilation: 1 Effacement (%): 50 Exam by:: dr Deiondra Denley Blood pressure 101/69, pulse 86, temperature 98 F (36.7 C), temperature source Oral, resp. rate 18, height 5\' 1"  (1.549 m), weight 65 kg. Exam Physical Exam  Physical exam:  A&O x 3, no acute distress. Pleasant HEENT neg, no thyromegaly Lungs CTA bilat CV RRR, S1S2 normal Abdo soft, non tender, non acute Extr no edema/ tenderness Pelvic f/tip,posterior Vx -- also by bedside sono as difficult exam  FHT  130s +accels no decels mod variab cat I Toco q 3  min  Prenatal labs: ABO, Rh: --/--/A POS (05/28 0818) Antibody: NEG (05/28 0818) Rubella: Immune (11/09 0000) RPR: Nonreactive (11/09 0000)  HBsAg: Negative (11/09 0000)  HIV: Non-reactive (11/09 0000)  GBS: Negative/-- (05/10 0000)  NIPT nl AFP1 low risk  Assessment/Plan: 40 yo G2P1001. At 39 wks. IOL for GDM and AMA Cytotec x 1 dose buccal, ambulate, assess for engagement, EFW 7 lbs, working towards SVD FHT cAT I  CBG q 4 hrs now and q 2 hrs in active labor   [name] 02/27/2021, 10:47 AM

## 2021-02-27 NOTE — Progress Notes (Signed)
Andrea [name] is a 40 y.o. G2P1000 at [redacted]w[redacted]d by ultrasound admitted for induction of labor due to Gestational diabetes and AMA at age 17.  Subjective: No complaints. S/p epidural   Objective: BP 111/66   Pulse 64   Temp 98.7 F (37.1 C) (Oral)   Resp 20   Ht 5\' 1"  (1.549 m)   Wt 65 kg   SpO2 100%   BMI 27.08 kg/m  No intake/output data recorded. No intake/output data recorded.  FHT:  FHR: 150s bpm, variability: moderate,  accelerations:  Present,  decelerations:  Absent UC:   regular, every 2-3 minutes SVE:   Dilation:  (no change) Effacement (%): 50 Exam by:: rebecca [email or web address]  Labs: Lab Results  Component Value Date   WBC 8.2 02/27/2021   HGB 13.2 02/27/2021   HCT 40.4 02/27/2021   MCV 86.7 02/27/2021   PLT 129 (L) 02/27/2021    Assessment / Plan: Early labor, s/p one Cytotec and having moderatecontractions, so holding pitocin. No cervical change per RN  Fetal Wellbeing:  Category I Pain Control:  Epidural I/D:  n/a Anticipated MOD:  NSVD  Andrea [name] 02/27/2021, 4:22 PM

## 2021-02-28 ENCOUNTER — Encounter (HOSPITAL_COMMUNITY): Payer: Self-pay | Admitting: Obstetrics & Gynecology

## 2021-02-28 DIAGNOSIS — O99892 Other specified diseases and conditions complicating childbirth: Secondary | ICD-10-CM | POA: Diagnosis present

## 2021-02-28 LAB — CBC
HCT: 35.9 % — ABNORMAL LOW (ref 36.0–46.0)
HCT: 35.9 % — ABNORMAL LOW (ref 36.0–46.0)
Hemoglobin: 11.6 g/dL — ABNORMAL LOW (ref 12.0–15.0)
Hemoglobin: 11.4 g/dL — ABNORMAL LOW (ref 12.0–15.0)
MCH: 28.4 pg (ref 26.0–34.0)
MCH: 27.9 pg (ref 26.0–34.0)
MCHC: 32.3 g/dL (ref 30.0–36.0)
MCHC: 31.8 g/dL (ref 30.0–36.0)
MCV: 87.8 fL (ref 80.0–100.0)
MCV: 88 fL (ref 80.0–100.0)
Platelets: 112 10*3/uL — ABNORMAL LOW (ref 150–400)
Platelets: 114 10*3/uL — ABNORMAL LOW (ref 150–400)
RBC: 4.09 MIL/uL (ref 3.87–5.11)
RBC: 4.08 MIL/uL (ref 3.87–5.11)
RDW: 13.3 % (ref 11.5–15.5)
RDW: 13.3 % (ref 11.5–15.5)
WBC: 14.4 10*3/uL — ABNORMAL HIGH (ref 4.0–10.5)
WBC: 13.8 10*3/uL — ABNORMAL HIGH (ref 4.0–10.5)
nRBC: 0 % (ref 0.0–0.2)
nRBC: 0 % (ref 0.0–0.2)

## 2021-02-28 LAB — GLUCOSE, CAPILLARY: Glucose-Capillary: 104 mg/dL — ABNORMAL HIGH (ref 70–99)

## 2021-02-28 MED ORDER — OXYCODONE HCL 5 MG PO TABS: 5.0000 mg | ORAL_TABLET | Freq: Four times a day (QID) | ORAL | Status: DC | PRN

## 2021-02-28 MED ORDER — STERILE WATER FOR IRRIGATION IR SOLN
Status: DC | PRN
  Administered 2021-02-28: 1000 mL

## 2021-02-28 MED ORDER — NALOXONE HCL 0.4 MG/ML IJ SOLN: 0.4000 mg | INTRAMUSCULAR | Status: DC | PRN

## 2021-02-28 MED ORDER — NALBUPHINE HCL 10 MG/ML IJ SOLN: 5.0000 mg | Freq: Once | INTRAMUSCULAR | Status: DC | PRN

## 2021-02-28 MED ORDER — FENTANYL CITRATE (PF) 100 MCG/2ML IJ SOLN: 25.0000 ug | INTRAMUSCULAR | Status: DC | PRN

## 2021-02-28 MED ORDER — NALBUPHINE HCL 10 MG/ML IJ SOLN: 5.0000 mg | INTRAMUSCULAR | Status: DC | PRN

## 2021-02-28 MED ORDER — PRENATAL MULTIVITAMIN CH
1.0000 | ORAL_TABLET | Freq: Every day | ORAL | Status: DC
  Administered 2021-02-28: 1 via ORAL
  Filled 2021-02-28: qty 1

## 2021-02-28 MED ORDER — DIPHENHYDRAMINE HCL 25 MG PO CAPS: 25.0000 mg | ORAL_CAPSULE | ORAL | Status: DC | PRN

## 2021-02-28 MED ORDER — LACTATED RINGERS IV SOLN: INTRAVENOUS | Status: DC

## 2021-02-28 MED ORDER — IBUPROFEN 600 MG PO TABS
600.0000 mg | ORAL_TABLET | Freq: Four times a day (QID) | ORAL | Status: DC
  Administered 2021-03-01 (×2): 600 mg via ORAL
  Filled 2021-02-28 (×2): qty 1

## 2021-02-28 MED ORDER — DIPHENHYDRAMINE HCL 25 MG PO CAPS
25.0000 mg | ORAL_CAPSULE | Freq: Four times a day (QID) | ORAL | Status: DC | PRN
  Filled 2021-02-28: qty 1

## 2021-02-28 MED ORDER — MORPHINE SULFATE (PF) 0.5 MG/ML IJ SOLN
INTRAMUSCULAR | Status: DC | PRN
  Administered 2021-02-27: 3 mg via EPIDURAL

## 2021-02-28 MED ORDER — SODIUM CHLORIDE 0.9 % IR SOLN
Status: DC | PRN
  Administered 2021-02-28: 1

## 2021-02-28 MED ORDER — TETANUS-DIPHTH-ACELL PERTUSSIS 5-2.5-18.5 LF-MCG/0.5 IM SUSY: 0.5000 mL | PREFILLED_SYRINGE | Freq: Once | INTRAMUSCULAR | Status: DC

## 2021-02-28 MED ORDER — MENTHOL 3 MG MT LOZG: 1.0000 | LOZENGE | OROMUCOSAL | Status: DC | PRN

## 2021-02-28 MED ORDER — ACETAMINOPHEN 500 MG PO TABS
1000.0000 mg | ORAL_TABLET | Freq: Four times a day (QID) | ORAL | Status: AC
  Administered 2021-02-28 (×2): 1000 mg via ORAL
  Filled 2021-02-28 (×2): qty 2

## 2021-02-28 MED ORDER — ONDANSETRON HCL 4 MG/2ML IJ SOLN: 4.0000 mg | Freq: Three times a day (TID) | INTRAMUSCULAR | Status: DC | PRN

## 2021-02-28 MED ORDER — NALBUPHINE HCL 10 MG/ML IJ SOLN
5.0000 mg | Freq: Once | INTRAMUSCULAR | Status: DC | PRN
Start: 2021-02-28 — End: 2021-03-01

## 2021-02-28 MED ORDER — SCOPOLAMINE 1 MG/3DAYS TD PT72
MEDICATED_PATCH | TRANSDERMAL | Status: AC
  Filled 2021-02-28: qty 1

## 2021-02-28 MED ORDER — DIBUCAINE (PERIANAL) 1 % EX OINT: 1.0000 "application " | TOPICAL_OINTMENT | CUTANEOUS | Status: DC | PRN

## 2021-02-28 MED ORDER — KETOROLAC TROMETHAMINE 30 MG/ML IJ SOLN: 30.0000 mg | Freq: Four times a day (QID) | INTRAMUSCULAR | Status: DC | PRN

## 2021-02-28 MED ORDER — SIMETHICONE 80 MG PO CHEW
80.0000 mg | CHEWABLE_TABLET | Freq: Three times a day (TID) | ORAL | Status: DC
  Administered 2021-02-28 – 2021-03-01 (×4): 80 mg via ORAL
  Filled 2021-02-28 (×4): qty 1

## 2021-02-28 MED ORDER — COCONUT OIL OIL: 1.0000 "application " | TOPICAL_OIL | Status: DC | PRN

## 2021-02-28 MED ORDER — SCOPOLAMINE 1 MG/3DAYS TD PT72
1.0000 | MEDICATED_PATCH | Freq: Once | TRANSDERMAL | Status: DC
  Administered 2021-02-28: 1.5 mg via TRANSDERMAL

## 2021-02-28 MED ORDER — DIPHENHYDRAMINE HCL 50 MG/ML IJ SOLN: 12.5000 mg | INTRAMUSCULAR | Status: DC | PRN

## 2021-02-28 MED ORDER — WITCH HAZEL-GLYCERIN EX PADS: 1.0000 "application " | MEDICATED_PAD | CUTANEOUS | Status: DC | PRN

## 2021-02-28 MED ORDER — MEPERIDINE HCL 25 MG/ML IJ SOLN
INTRAMUSCULAR | Status: DC | PRN
  Administered 2021-02-27 – 2021-02-28 (×2): 12.5 mg via INTRAVENOUS

## 2021-02-28 MED ORDER — SENNOSIDES-DOCUSATE SODIUM 8.6-50 MG PO TABS
2.0000 | ORAL_TABLET | Freq: Every day | ORAL | Status: DC
  Administered 2021-03-01: 2 via ORAL
  Filled 2021-02-28: qty 2

## 2021-02-28 MED ORDER — OXYTOCIN-SODIUM CHLORIDE 30-0.9 UT/500ML-% IV SOLN
2.5000 [IU]/h | INTRAVENOUS | Status: AC
  Administered 2021-02-28: 2.5 [IU]/h via INTRAVENOUS
  Filled 2021-02-28: qty 500

## 2021-02-28 MED ORDER — ZOLPIDEM TARTRATE 5 MG PO TABS: 5.0000 mg | ORAL_TABLET | Freq: Every evening | ORAL | Status: DC | PRN

## 2021-02-28 MED ORDER — KETOROLAC TROMETHAMINE 30 MG/ML IJ SOLN
30.0000 mg | Freq: Once | INTRAMUSCULAR | Status: DC | PRN
Start: 2021-02-28 — End: 2021-02-28

## 2021-02-28 MED ORDER — SIMETHICONE 80 MG PO CHEW: 80.0000 mg | CHEWABLE_TABLET | ORAL | Status: DC | PRN

## 2021-02-28 MED ORDER — ACETAMINOPHEN 325 MG PO TABS: 650.0000 mg | ORAL_TABLET | ORAL | Status: DC | PRN

## 2021-02-28 MED ORDER — NALOXONE HCL 4 MG/10ML IJ SOLN
1.0000 ug/kg/h | INTRAVENOUS | Status: DC | PRN
  Filled 2021-02-28: qty 5

## 2021-02-28 MED ORDER — SODIUM CHLORIDE 0.9 % IV SOLN: INTRAVENOUS | Status: DC | PRN

## 2021-02-28 MED ORDER — KETOROLAC TROMETHAMINE 30 MG/ML IJ SOLN
30.0000 mg | Freq: Four times a day (QID) | INTRAMUSCULAR | Status: AC
  Administered 2021-02-28 (×4): 30 mg via INTRAVENOUS
  Filled 2021-02-28 (×4): qty 1

## 2021-02-28 MED ORDER — SODIUM CHLORIDE 0.9% FLUSH: 3.0000 mL | INTRAVENOUS | Status: DC | PRN

## 2021-02-28 NOTE — Anesthesia Postprocedure Evaluation (Signed)
Anesthesia Post Note  Patient: Andrea [name]  Procedure(s) Performed: CESAREAN SECTION (N/A )     Patient location during evaluation: PACU Anesthesia Type: Epidural Level of consciousness: awake and alert Pain management: pain level controlled Vital Signs Assessment: post-procedure vital signs reviewed and stable Respiratory status: spontaneous breathing, nonlabored ventilation and respiratory function stable Cardiovascular status: stable Postop Assessment: no headache, no backache and epidural receding Anesthetic complications: no   No complications documented.  Last Vitals:  Vitals:   02/28/21 0334 02/28/21 0501  BP: 102/72 90/67  Pulse: 70 73  Resp: 18 16  Temp: 36.5 C 36.9 C  SpO2: 99% 99%    Last Pain:  Vitals:   02/28/21 0501  TempSrc: Oral  PainSc:    Pain Goal:                   Andrea [name]

## 2021-02-28 NOTE — Progress Notes (Signed)
Subjective: Postpartum Day 1: Primary Cesarean Delivery for unengaged head and turning oblique in labor  Baby Girl, 6'10", breast feeding  Patient reports feeling dizzy. No SOB/palpitations/ CP. Not ready to get out of bed. Lochia moderate. No concerns with bleeding on checks per RN.  Tolerating regular diet,no n/v.   Objective: Vital signs in last 24 hours: BP 90/67 (BP Location: Left Arm)   Pulse 73   Temp 98.5 F (36.9 C) (Oral)   Resp 16   Ht 5\' 1"  (1.549 m)   Wt 65 kg   SpO2 99%   Breastfeeding Unknown   BMI 27.08 kg/m   Temp:  [97.6 F (36.4 C)-98.7 F (37.1 C)] 98.5 F (36.9 C) (05/29 0501) Pulse Rate:  [64-107] 73 (05/29 0501) Resp:  [16-26] 16 (05/29 0501) BP: (87-140)/(55-88) 90/67 (05/29 0501) SpO2:  [97 %-100 %] 99 % (05/29 0501) Weight:  [65 kg] 65 kg (05/28 0813)  Physical Exam:  General: alert and cooperative  Lungs CTA b/; CV RRR Lochia: appropriate Abdomen soft, active BS  Uterine Fundus: firm Incision: no significant drainage DVT Evaluation: No evidence of DVT seen on physical exam.  CBC Latest Ref Rng & Units 02/28/2021 02/28/2021 02/27/2021  WBC 4.0 - 10.5 K/uL 13.8(H) 14.4(H) 8.2  Hemoglobin 12.0 - 15.0 g/dL 11.4(L) 11.6(L) 13.2  Hematocrit 36.0 - 46.0 % 35.9(L) 35.9(L) 40.4  Platelets 150 - 400 K/uL 114(L) 112(L) 129(L)  Rh pos Rubella Immune   All vaccines uptodate   Assessment/Plan: Status post 1' LTCS, POD #1,  Doing well postoperatively. Continue current PP and post-op care. Lactation support  A1GDM- A1C today. Resume routine diet and recheck home CBGs in 2 weeks  Dizziness- likely from narcotic meds and lack of sleep since vital and CBC stable.   [name] 02/28/2021, 7:14 AM

## 2021-02-28 NOTE — Lactation Note (Signed)
This note was copied from a baby's chart. Lactation Consultation Note  Patient Name: Andrea [name] [id number] Date: 02/28/2021   Age:39 hours  LC checked in to see if Mom wanted to use a DEBP to maintain her milk supply. Mom sleeping at the time of the visit. Dad to call when she wakes to talk about using an electric pump.     Maternal Data    Feeding    LATCH Score                    Lactation Tools Discussed/Used    Interventions    Discharge    Consult Status      Andrea Hertenstein  [name] 02/28/2021, 5:33 PM

## 2021-02-28 NOTE — Lactation Note (Signed)
This note was copied from a baby's chart. Lactation Consultation Note  Patient Name: Andrea [name] [id number] Date: 02/28/2021 Reason for consult: Follow-up assessment;Mother's request;Difficult latch;Term Age:39 hours   LC talked with Mom desire to pump using DEBP to maintain her milk supply. Mother stated she would like to start pumping. Flange size assessed at 24, Mom states is comfortable fit.   Plan 1. To feed based on cues 8-12x in 24 hr period no more than 4 hrs without an attempt. Mom to offer both breasts with compression looking for swallows. [place] reviewed with Mother how to get a deeper latch and engage infant at the breast to extend feedings.  2. Dad to supplement based on breastfeeding EBM, formula via  supplementation guide and hrs of age since delivery. Parents doing paced bottle feeding with slow flow nipple to offer more if showing hunger cues.   3. Mom to pump DEBP q 3 hrs for 15 minutes.   All questions answered at the end of the visit.   Maternal Data Has patient been taught Hand Expression?: Yes  Feeding Mother's Current Feeding Choice: Breast Milk and Formula  LATCH Score Latch: Repeated attempts needed to sustain latch, nipple held in mouth throughout feeding, stimulation needed to elicit sucking reflex.  Audible Swallowing: Spontaneous and intermittent  Type of Nipple: Everted at rest and after stimulation  Comfort (Breast/Nipple): Filling, red/small blisters or bruises, mild/mod discomfort  Hold (Positioning): Assistance needed to correctly position infant at breast and maintain latch.  LATCH Score: 7   Lactation Tools Discussed/Used Tools: Flanges;Pump Flange Size: 24 Breast pump type: Double-Electric Breast Pump Pump Education: Setup, frequency, and cleaning;Milk Storage Reason for Pumping: increase stimulation Pumping frequency: every 3 hrs for 15 minutes  Interventions Interventions: Breast feeding basics reviewed;Adjust position;Assisted with  latch;Support pillows;Skin to skin;Position options;Education;Breast massage;Expressed milk;Hand express;Breast compression;DEBP  Discharge Pump: Personal  Consult Status Consult Status: Follow-up Date: 03/01/21 Follow-up type: In-patient    Andrea Leclere  [name] 02/28/2021, 6:52 PM

## 2021-02-28 NOTE — Progress Notes (Signed)
   02/28/21 0559  Orthostatic Sitting  BP- Sitting 114/80  Pulse- Sitting 77  Pt has been complaining of being dizzy since she was in the PACU. Her vitals are stable and bleeding is small. Pt was able to take a nap and said she felt better after that. Began pt's orthostatic vitals, while sitting pt became dizzy again so got back in bed. She hasn't had anything to eat since surgery, I encouraged her to order breakfast and then she can try and get up after eating.

## 2021-02-28 NOTE — Progress Notes (Signed)
Ambulated patient to the bathroom and assisted with peri care. Patient did well; however, when she got to the sink to wash her hands, she became dizzy. Assisted patient back to bed and encouraged increased po fluids. Maxwell Caul, Leretha Dykes [place]

## 2021-02-28 NOTE — Lactation Note (Signed)
This note was copied from a baby's chart. Lactation Consultation Note  Patient Name: Andrea [name] [id number] Date: 02/28/2021 Reason for consult: Initial assessment;Term Age:39 hours   P2 mother whose infant is now 33 hours old.  This is a term baby at 39+0 weeks.  Mother breast fed her first child (now 42 years old) for 9-10 months.    Baby has had three low blood sugars since delivery: 41, 39 and 36.  She has received glucose gel and has been formula feeding during the night with Enfamil 20 consuming 20-24 mls every three hours.  Consulted with RN and plan made to withhold breast feeding for the next hour (until 1100) when the next blood sugar level will be drawn.  Suggested mother hold baby STS during this timeframe and placed baby on mother's chest; hat on and blanket to cover.  Reviewed breast feeding basics.  Taught hand expression but mother was unable to express colostrum at this time.  Answered mother's questions and discussed pumping and milk storage per mother's request.  Will follow up as needed after blood sugar is obtained.  Mother has a DEBP for home use.  Father present.   Maternal Data Has patient been taught Hand Expression?: Yes Does the patient have breastfeeding experience prior to this delivery?: Yes How long did the patient breastfeed?: 9-10 months  Feeding Mother's Current Feeding Choice: Breast Milk and Formula Nipple Type: Slow - flow  LATCH Score Latch: Grasps breast easily, tongue down, lips flanged, rhythmical sucking.  Audible Swallowing: A few with stimulation  Type of Nipple: Everted at rest and after stimulation  Comfort (Breast/Nipple): Soft / non-tender  Hold (Positioning): Assistance needed to correctly position infant at breast and maintain latch.  LATCH Score: 8   Lactation Tools Discussed/Used    Interventions    Discharge Pump: Personal  Consult Status Consult Status: Follow-up Date: 03/01/21 Follow-up type:  In-patient    Andrea [name] 02/28/2021, 10:12 AM

## 2021-02-28 NOTE — Op Note (Addendum)
Cesarean Section Procedure Note   Kampbell Holaway  02/27/2021 Procedure: Primary Low Transverse Cesarean section  Indications: 40 yo female at 39 weeks, induction due to [facility] Unengaed head turned oblique lie in labor, failure to descend    Pre-operative Diagnosis: primary cesarean section for failure to descend, oblique presentation .   Post-operative Diagnosis: Same   Surgeon: Azucena Fallen, MD - Primary   Assistants: none  Anesthesia: epidural   Procedure Details:  The patient was seen in the Labor Room. Head was now in right lower quadrant and no presenting part noted per vaginal exam. She was advised C-section after discussing options. The risks, benefits, complications, treatment options, and expected outcomes were discussed with the patient. The patient concurred with the proposed plan, giving informed consent. identified as Farris Has and the procedure verified as C-Section Delivery. A Time Out was held and the above information confirmed. 2 gm Ancef given.  After induction of anesthesia, the patient was draped and prepped in the usual sterile manner, foley was draining urine well.  A pfannenstiel incision was made and carried down through the subcutaneous tissue to the fascia. Fascial incision was made and extended transversely. The fascia was separated from the underlying rectus tissue superiorly and inferiorly. The peritoneum was identified and entered. Peritoneal incision was extended longitudinally. Alexis-O retractor placed. The utero-vesical peritoneal reflection was incised transversely and the bladder flap was bluntly freed from the lower uterine segment. A low transverse uterine incision was made. Copious bleeding started from cut edges from sinuses. Head was unengaged, floating. Head from brought to incision and flexion maintained and then delivered by fundal pressure. Baby girl born at 11.37 PM on 02/27/21 with vigorous cry. Apgar scores of 8 at one minute and 9 at five minutes.  Delayed cord clamping done at 1 minute and baby handed to NICU team in attendance. Cord ph was not sent. Cord blood was obtained for evaluation. The placenta was removed Intact and appeared normal.  The uterine outline, tubes and ovaries appeared normal, no extensions noted. The uterine incision was closed with running locked sutures of 0 Monocryl. . A second imbricating layer sutured.   Hemostasis was observed. Alexis retractor removed. Peritoneal closure done with 2-0 Vicryl.  The fascia was then reapproximated with running sutures of 0Vicryl. The subcuticular closure was performed using 2-0plain gut. The skin was closed with 4-0Vicryl.  Sterile dressings placed.  All counts were correct prior the abdominal closure and were correct at the conclusion of the case.   Findings:Baby girl, loose nuchal cord, short cord. Born at 11.37 pm [birth year], cephalic from [facility] hysterotomy.    Estimated Blood Loss: 621 mL   Total IV Fluids: 2100 ml LR  Urine Output: 150CC OF clear urine  Specimens: cord blood and placenta   Complications: no complications  Disposition: PACU - hemodynamically stable.   Maternal Condition: stable   Baby condition / location:  Couplet care / Skin to Skin  Attending Attestation: I performed the procedure.   Signed: Surgeon(s): Azucena Fallen, MD

## 2021-02-28 NOTE — Transfer of Care (Signed)
Immediate Anesthesia Transfer of Care Note  Patient: Andrea [name]  Procedure(s) Performed: CESAREAN SECTION (N/A )  Patient Location: PACU  Anesthesia Type:Epidural  Level of Consciousness: awake, alert  and oriented  Airway & Oxygen Therapy: Patient Spontanous Breathing  Post-op Assessment: Report given to RN and Post -op Vital signs reviewed and stable  Post vital signs: Reviewed and stable  Last Vitals:  Vitals Value Taken Time  BP 114/72 02/28/21 0030  Temp    Pulse 73 02/28/21 0041  Resp 17 02/28/21 0041  SpO2 100 % 02/28/21 0041  Vitals shown include unvalidated device data.  Last Pain:  Vitals:   02/28/21 0030  TempSrc:   PainSc: 0-No pain         Complications: No complications documented.

## 2021-03-01 DIAGNOSIS — O24410 Gestational diabetes mellitus in pregnancy, diet controlled: Secondary | ICD-10-CM | POA: Diagnosis present

## 2021-03-01 DIAGNOSIS — O99119 Other diseases of the blood and blood-forming organs and certain disorders involving the immune mechanism complicating pregnancy, unspecified trimester: Secondary | ICD-10-CM | POA: Diagnosis present

## 2021-03-01 DIAGNOSIS — D649 Anemia, unspecified: Secondary | ICD-10-CM

## 2021-03-01 DIAGNOSIS — D696 Thrombocytopenia, unspecified: Secondary | ICD-10-CM | POA: Diagnosis present

## 2021-03-01 MED ORDER — SENNOSIDES-DOCUSATE SODIUM 8.6-50 MG PO TABS: 2.0000 | ORAL_TABLET | Freq: Every evening | ORAL | 0 refills | Status: DC | PRN

## 2021-03-01 MED ORDER — COCONUT OIL OIL: 1.0000 "application " | TOPICAL_OIL | 0 refills | Status: AC | PRN

## 2021-03-01 MED ORDER — ACETAMINOPHEN 500 MG PO TABS: 1000.0000 mg | ORAL_TABLET | Freq: Four times a day (QID) | ORAL | 0 refills | Status: AC

## 2021-03-01 MED ORDER — IBUPROFEN 600 MG PO TABS: 600.0000 mg | ORAL_TABLET | Freq: Four times a day (QID) | ORAL | 0 refills | Status: AC

## 2021-03-01 MED ORDER — ACETAMINOPHEN 500 MG PO TABS
1000.0000 mg | ORAL_TABLET | Freq: Four times a day (QID) | ORAL | Status: DC
  Administered 2021-03-01: 1000 mg via ORAL
  Filled 2021-03-01: qty 2

## 2021-03-01 MED ORDER — PRENATAL MULTIVITAMIN CH: 1.0000 | ORAL_TABLET | Freq: Every day | ORAL | Status: DC

## 2021-03-01 NOTE — Progress Notes (Signed)
POSTOPERATIVE DAY # 2 S/P Primary LTCS for unengaged head turning oblique in labor, baby girl "[place]"   S:         Reports feeling okay, c/o more incisional pain today with movement. Reports suboptimal pain control with Motrin, but declines narcotics.              Tolerating po intake / no nausea / no vomiting / + flatus / no BM  Denies dizziness, SOB, or CP             Bleeding is light             Up ad lib / ambulatory/ voiding QS without difficulty   Newborn breast and formula feeding   O:  VS: BP (!) 91/52 (BP Location: Right Arm)   Pulse 84   Temp 98.6 F (37 C) (Oral)   Resp 19   Ht 5\' 1"  (1.549 m)   Wt 65 kg   SpO2 98%   Breastfeeding Unknown   BMI 27.08 kg/m   Patient Vitals for the past 24 hrs:  BP Temp Temp src Pulse Resp SpO2  03/01/21 0533 (!) 91/52 98.6 F (37 C) Oral 84 19 98 %  02/28/21 1946 (!) 98/52 98.5 F (36.9 C) Oral 73 18 100 %  02/28/21 1514 (!) 94/54 98.6 F (37 C) Oral 62 17 100 %  02/28/21 1130 -- 97.9 F (36.6 C) Oral -- 16 98 %     LABS:              Recent Labs    02/28/21 0103 02/28/21 0407  WBC 14.4* 13.8*  HGB 11.6* 11.4*  PLT 112* 114*               Bloodtype: --/--/A POS (05/28 0818)  Rubella: Immune (11/09 0000)                                             I&O: Intake/Output      05/29 0701 05/30 0700 05/30 0701 05/31 0700   P.O. 980    I.V. (mL/kg) 446.2 (6.9)    Other     IV Piggyback     Total Intake(mL/kg) 1426.2 (21.9)    Urine (mL/kg/hr) 3025 (1.9)    Blood     Total Output 3025    Net -1598.8           Flu: Covid: UTD Tdap: UTD             Physical Exam:             Alert and Oriented X3  Lungs: Clear and unlabored  Heart: regular rate and rhythm / no murmurs  Abdomen: soft, non-tender, mild gaseous distention, active bowel sounds in all quadrants              Fundus: appropriate tenderness,  firm, non-tender, below umbilicus             Dressing honeycomb with steri-strips, old marked drainage noted               Incision:  approximated with sutures / no erythema / no ecchymosis / no drainage  Perineum: intact  Lochia: appropriate   Extremities: trace LE edema, no calf pain or tenderness  A/P:    POD # 2 S/P Primary LTCS  A1GDM   - Hemoglobin A1c pending    - F/u 6-8 weeks PP  Gestational thrombocytopenia on admission   - plt count stable   Acute on chronic anemia    - hemoglobin stable, asymptomatic   Routine postoperative care   Ambulation encouraged  Abdominal binder per patient request  Suboptimal pain control - pt. Declines narcotics, add Tylenol 1000mg  every 6 hrs scheduled   D/C IV site             D/c home today; WOB discharge book given, instructions and warning s/s reviewed   F/u with Dr. Benjie Karvonen in 6 weeks  Lars Pinks, MSN, [facility]

## 2021-03-01 NOTE — Discharge Summary (Signed)
Postpartum Discharge Summary  Date of Service updated 03/01/2021     Patient Name: Andrea [name] DOB: 1980/10/22 MRN: [id number]  Date of admission: 02/27/2021 Delivery date:02/27/2021  Delivering provider: MODY, VAISHALI  Date of discharge: 03/01/2021  Admitting diagnosis: Encounter for induction of labor [Z34.90] Delivery by emergency cesarean [O99.892] Intrauterine pregnancy: [redacted]w[redacted]d    Secondary diagnosis:  Principal Problem:   Postpartum care following cesarean delivery 5/28 Active Problems:   Encounter for induction of labor   Delivery by emergency cesarean   GDM, class A1   Gestational thrombocytopenia ([place]   Acute on chronic anemia  Additional problems: none    Discharge diagnosis: Term Pregnancy Delivered, GDM A1 and Anemia                                              Post partum procedures:n/a Augmentation: Pitocin, Cytotec and IP Foley Complications: None  Hospital course: Induction of Labor With Cesarean Section   40y.o. yo G2P2001 at 353w0das admitted to the hospital 02/27/2021 for induction of labor for A1GDM and AMA. Patient had a labor course significant for 1 dose of buccal Cytotec, foley bulb, and then was augmented with Pitocin. After foley bulb expelled, no presenting part was in the inlet c/w oblique lie and patient declined attempt for version. The patient went for cesarean section due to failure to descend, oblique presentation. Delivery details are as follows: Membrane Rupture Time/Date: 11:36 PM ,02/27/2021   Delivery Method:C-Section, Low Transverse  Details of operation can be found in separate operative Note.  Patient had an uncomplicated postpartum course. She is ambulating, tolerating a regular diet, passing flatus, and urinating well.  Patient is discharged home in stable condition on 03/01/21.      Newborn Data: Birth date:02/27/2021  Birth time:11:37 PM  Gender:Female  Living status:Living  Apgars:8 ,9  Weight:3020 g                                "KrHonor Junes Magnesium Sulfate received: No BMZ received: No Rhophylac:N/A MMR:N/A T-DaP:Given prenatally Flu: Yes  Covid: UTD Transfusion:No  Physical exam  Vitals:   02/28/21 1130 02/28/21 1514 02/28/21 1946 03/01/21 0533  BP:  (!) 94/54 (!) 98/52 (!) 91/52  Pulse:  62 73 84  Resp: _0 Temp: 97.9 F (36.6 C) 98.6 F (37 C) 98.5 F (36.9 C) 98.6 F (37 C)  TempSrc: Oral Oral Oral Oral  SpO2: 98% 100% 100% 98%  Weight:      Height:       Alert and Oriented X3  Lungs: Clear and unlabored  Heart: regular rate and rhythm / no murmurs  Abdomen: soft, non-tender, mild gaseous distention, active bowel sounds in all quadrants              Fundus: appropriate tenderness,  firm, non-tender, below umbilicus             Dressing honeycomb with steri-strips, old marked drainage noted              Incision:  approximated with sutures / no erythema / no ecchymosis / no drainage  Perineum: intact  Lochia: appropriate   Extremities: trace LE edema, no calf pain or tenderness  Labs: Lab Results  Component Value Date   WBC  13.8 (H) 02/28/2021   HGB 11.4 (L) 02/28/2021   HCT 35.9 (L) 02/28/2021   MCV 88.0 02/28/2021   PLT 114 (L) 02/28/2021   No flowsheet data found. Edinburgh Score: Edinburgh Postnatal Depression Scale Screening Tool 02/28/2021  I have been able to laugh and see the funny side of things. 0  I have looked forward with enjoyment to things. 0  I have blamed myself unnecessarily when things went wrong. 0  I have been anxious or worried for no good reason. 2  I have felt scared or panicky for no good reason. 1  Things have been getting on top of me. 0  I have been so unhappy that I have had difficulty sleeping. 0  I have felt sad or miserable. 0  I have been so unhappy that I have been crying. 0  The thought of harming myself has occurred to me. 0  Edinburgh Postnatal Depression Scale Total 3      After visit meds:  Allergies as of 03/01/2021   No  Known Allergies     Medication List    TAKE these medications   acetaminophen 500 MG tablet Commonly known as: TYLENOL Take 2 tablets (1,000 mg total) by mouth every 6 (six) hours.   coconut oil Oil Apply 1 application topically as needed.   ibuprofen 600 MG tablet Commonly known as: ADVIL Take 1 tablet (600 mg total) by mouth every 6 (six) hours.   prenatal multivitamin Tabs tablet Take 1 tablet by mouth daily at 12 noon.   senna-docusate 8.6-50 MG tablet Commonly known as: Senokot-S Take 2 tablets by mouth at bedtime as needed for mild constipation.        Discharge home in stable condition Infant Feeding: Bottle and Breast Infant Disposition:home with mother Discharge instruction: per After Visit Summary and Postpartum booklet. Activity: Advance as tolerated. Pelvic rest for 6 weeks.  Diet: low salt diet Anticipated Birth Control: not discussed Postpartum Appointment:6 weeks Additional Postpartum F/U: Postpartum Depression checkup Future Appointments:No future appointments. Follow up Visit:  Follow-up Information    Azucena Fallen, MD. Schedule an appointment as soon as possible for a visit in 6 week(s).   Specialty: Obstetrics and Gynecology Why: Postpartum visit Contact information: [place] [address] [phone]                   03/01/2021 Darliss Cheney, CNM

## 2021-03-01 NOTE — Lactation Note (Signed)
This note was copied from a baby's chart. Lactation Consultation Note  Patient Name: Andrea [name] FOYDX'[id number] Date: 03/01/2021 Reason for consult: Follow-up assessment;Primapara;1st time breastfeeding;Term Age:39 hours   P2 mother whose infant is now 58 hours old.  This is a term baby at 39+0 weeks.  Mother breast fed her first child (now 41 years old) for 9-10 months.  Father was changing baby's diaper when I arrived; offered to assist with latching and mother agreeable.  Reviewed positioning and breast support.  Assisted to latch, however, baby was not interested in initiating a suck; no feeding cues observed.  Reviewed mother's pump and pump set up and observed the #24 flange size to be appropriate.  Mother stated she has been pumping every three hours and is obtaining colostrum drops.  She will continue pumping every three hours today.  Encouraged to call for latch assistance with the next feeding.  Mother has a DEBP for home use.     Maternal Data Has patient been taught Hand Expression?: Yes Does the patient have breastfeeding experience prior to this delivery?: Yes How long did the patient breastfeed?: 9-10 months  Feeding Mother's Current Feeding Choice: Breast Milk and Formula  LATCH Score                    Lactation Tools Discussed/Used Tools: Pump;Flanges Flange Size: 24 Breast pump type: Double-Electric Breast Pump;Manual Pump Education: Setup, frequency, and cleaning (Reviewed) Reason for Pumping: Breast stimulation Pumping frequency: After feedings  Interventions Interventions: Breast feeding basics reviewed;Assisted with latch;Skin to skin;Breast compression;Adjust position;Position options;Support pillows;Education  Discharge Pump: DEBP;Manual;Personal  Consult Status Consult Status: Follow-up Date: 03/02/21 Follow-up type: In-patient    Little Ishikawa 03/01/2021, 7:54 AM

## 2021-03-02 LAB — HEMOGLOBIN A1C
Hgb A1c MFr Bld: 5 % (ref 4.8–5.6)
Mean Plasma Glucose: 97 mg/dL

## 2021-03-02 LAB — SURGICAL PATHOLOGY

## 2021-04-23 ENCOUNTER — Telehealth: Payer: Self-pay

## 2021-04-23 NOTE — Telephone Encounter (Signed)
See note

## 2021-04-23 NOTE — Telephone Encounter (Signed)
Andrea [name]'s husband called that he would like for his wife to start coming to Dr Jerline Pain as her PCP. Andrea [name]'s husband is Andrea [name] and he is a patient of Dr Jerline Pain. Can we schedule a new patient appt.

## 2021-04-25 NOTE — Telephone Encounter (Signed)
[place] with me.  Algis Greenhouse. Jerline Pain, MD 04/25/2021 6:51 PM

## 2021-04-26 NOTE — Telephone Encounter (Signed)
Please call pt and schedule New Patient Appt with Dr. Jerline Pain. Okay per him.

## 2021-04-27 NOTE — Telephone Encounter (Signed)
Patient has been scheduled

## 2021-07-08 ENCOUNTER — Other Ambulatory Visit: Payer: Self-pay

## 2021-07-08 ENCOUNTER — Encounter: Payer: Self-pay | Admitting: Family Medicine

## 2021-07-08 ENCOUNTER — Ambulatory Visit (INDEPENDENT_AMBULATORY_CARE_PROVIDER_SITE_OTHER): Payer: No Typology Code available for payment source | Admitting: Family Medicine

## 2021-07-08 VITALS — BP 105/66 | HR 77 | Temp 98.1°F | Ht 61.0 in | Wt 125.9 lb

## 2021-07-08 DIAGNOSIS — E538 Deficiency of other specified B group vitamins: Secondary | ICD-10-CM

## 2021-07-08 DIAGNOSIS — D179 Benign lipomatous neoplasm, unspecified: Secondary | ICD-10-CM | POA: Diagnosis not present

## 2021-07-08 DIAGNOSIS — E559 Vitamin D deficiency, unspecified: Secondary | ICD-10-CM

## 2021-07-08 DIAGNOSIS — R1013 Epigastric pain: Secondary | ICD-10-CM | POA: Diagnosis not present

## 2021-07-08 DIAGNOSIS — J029 Acute pharyngitis, unspecified: Secondary | ICD-10-CM

## 2021-07-08 MED ORDER — AZELASTINE HCL 0.1 % NA SOLN: 2.0000 | Freq: Two times a day (BID) | NASAL | 12 refills | Status: AC

## 2021-07-08 NOTE — Patient Instructions (Signed)
It was very nice to see you today!  Please try the nasal spray.  I believe this will help with your ear and throat symptoms.  Let me know if not improving over the next couple of weeks and I can refer you to see ear nose and throat doctor.  A small lump that you have on your skin on your arm and side are most likely benign growths called lipomas.  Please let me know if these change in any way and we can refer you to see a surgeon.  We will check blood work today.  Please see me back in a year or so for your Annual checkup with labs.  Come back to see me sooner if needed.  Take care, Dr Jerline Pain  PLEASE NOTE:  If you had any lab tests please let us know if you have not heard back within a few days. You may see your results on mychart before we have a chance to review them but we will give you a call once they are reviewed by [place]. If we ordered any referrals today, please let us know if you have not heard from their office within the next week.   Please try these tips to maintain a healthy lifestyle:  Eat at least 3 REAL meals and 1-2 snacks per day.  Aim for no more than 5 hours between eating.  If you eat breakfast, please do so within one hour of getting up.   Each meal should contain half fruits/vegetables, one quarter protein, and one quarter carbs (no bigger than a computer mouse)  Cut down on sweet beverages. This includes juice, soda, and sweet tea.   Drink at least 1 glass of water with each meal and aim for at least 8 glasses per day  Exercise at least 150 minutes every week.

## 2021-07-08 NOTE — Progress Notes (Signed)
Andrea [name] is a 40 y.o. female who presents today for an office visit. She is a new patient.   Assessment/Plan:  Chronic Problems Addressed Today: Epigastric abdominal pain She has had full GI work-up including ultrasound and HIDA scan as well as endoscopy.  We will check some labs today.  She will need to follow back with GI if this continues to be an issue.  Sore throat Likely postnasal drip.  Will start Astelin nasal spray.  She has some evidence of eustachian tube dysfunction as well.  She will check in with me in a couple weeks via MyChart.  If continues to be an issue will need referral back to ENT.  Lipoma No red flags.  Reassuring that lump has been present for the past year or so and has not changed significantly in size.  Discussed referral to surgery however patient declined.  We will continue with watchful waiting.   Preventative health care Follows with gynecology for women's health. Check labs today.     Subjective:  HPI:  She is a new patient. She had baby about few months ago. She have several issue she would like to be seen today. She complain of lumps in her arm. She notes lump been there for a year. She reports this has been  getting more painful.  No family history of lipoma.  No specific injuries or precipitating events.  She also complain of irritation in her throat. This has ongoing issue for several months.It comes and goes. She complains of burning sensation in the right ear. She would like to try nasal spray for her throat symptoms. Denies runny nose.   She complain of epigastric abdominal pain with eating food. She notes this happen whenever she eats. She saw her GI for this issue. She had endoscopy done which was normal.  ROS: Per HPI, otherwise a complete review of systems was negative.   PMH:  The following were reviewed and entered/updated in epic: Past Medical History:  Diagnosis Date   Blood transfusion without reported diagnosis    History of  postpartum hemorrhage    Patient Active Problem List   Diagnosis Date Noted   Lipoma 07/08/2021   Sore throat 07/08/2021   Epigastric abdominal pain 07/08/2021   Past Surgical History:  Procedure Laterality Date   CESAREAN SECTION N/A 02/27/2021   Procedure: CESAREAN SECTION;  Surgeon: Azucena Fallen, MD;  Location: [facility];  Service: Obstetrics;  Laterality: N/A;    Family History  Problem Relation Age of Onset   Hypertension Mother    Thyroid disease Mother    Glaucoma Mother    Cancer Maternal Grandfather     Medications- reviewed and updated Current Outpatient Medications  Medication Sig Dispense Refill   acetaminophen (TYLENOL) 500 MG tablet Take 2 tablets (1,000 mg total) by mouth every 6 (six) hours. 60 tablet 0   azelastine (ASTELIN) 0.1 % nasal spray Place 2 sprays into both nostrils 2 (two) times daily. 30 mL 12   coconut oil OIL Apply 1 application topically as needed.  0   ibuprofen (ADVIL) 600 MG tablet Take 1 tablet (600 mg total) by mouth every 6 (six) hours. 30 tablet 0   Prenatal Vit-Fe Fumarate-FA (PRENATAL MULTIVITAMIN) TABS tablet Take 1 tablet by mouth daily at 12 noon.     senna-docusate (SENOKOT-S) 8.6-50 MG tablet Take 2 tablets by mouth at bedtime as needed for mild constipation. 30 tablet 0   No current facility-administered medications for this visit.  Allergies-reviewed and updated No Known Allergies  Social History   Socioeconomic History   Marital status: Married    Spouse name: Not on file   Number of children: Not on file   Years of education: Not on file   Highest education level: Not on file  Occupational History   Not on file  Tobacco Use   Smoking status: Never   Smokeless tobacco: Never  Substance and Sexual Activity   Alcohol use: Not Currently   Drug use: Never   Sexual activity: Yes  Other Topics Concern   Not on file  Social History Narrative   Not on file   Social Determinants of Health   Financial Resource  Strain: Not on file  Food Insecurity: Not on file  Transportation Needs: Not on file  Physical Activity: Not on file  Stress: Not on file  Social Connections: Not on file            Objective:  Physical Exam: BP 105/66   Pulse 77   Temp 98.1 F (36.7 C) (Temporal)   Ht 5\' 1"  (1.549 m)   Wt 125 lb 14.4 oz (57.1 kg)   LMP 07/01/2021   SpO2 97%   BMI 23.79 kg/m   Gen: No acute distress, resting comfortably HEENT: Right TM with faint clear effusion.  Left TM clear.  OP erythematous.  No exudate.  No appreciable lymphadenopathy CV: Regular rate and rhythm with no murmurs appreciated Pulm: Normal work of breathing, clear to auscultation bilaterally with no crackles, wheezes, or rhonchi Skin: Freely mobile approximately 1 cm mass on left arm.  Similar mass on left flank. Neuro: Grossly normal, moves all extremities Psych: Normal affect and thought content       I,Savera Zaman,acting as a scribe for Dimas Chyle, MD.,have documented all relevant documentation on the behalf of Dimas Chyle, MD,as directed by  Dimas Chyle, MD while in the presence of Dimas Chyle, MD.   I, Dimas Chyle, MD, have reviewed all documentation for this visit. The documentation on 07/08/21 for the exam, diagnosis, procedures, and orders are all accurate and complete.   Algis Greenhouse. Jerline Pain, MD 07/08/2021 3:46 PM

## 2021-07-08 NOTE — Assessment & Plan Note (Signed)
Likely postnasal drip.  Will start Astelin nasal spray.  She has some evidence of eustachian tube dysfunction as well.  She will check in with me in a couple weeks via MyChart.  If continues to be an issue will need referral back to ENT.

## 2021-07-08 NOTE — Assessment & Plan Note (Signed)
She has had full GI work-up including ultrasound and HIDA scan as well as endoscopy.  We will check some labs today.  She will need to follow back with GI if this continues to be an issue.

## 2021-07-08 NOTE — Assessment & Plan Note (Signed)
No red flags.  Reassuring that lump has been present for the past year or so and has not changed significantly in size.  Discussed referral to surgery however patient declined.  We will continue with watchful waiting.

## 2021-07-09 LAB — CBC
HCT: 39.5 % (ref 36.0–46.0)
Hemoglobin: 12.4 g/dL (ref 12.0–15.0)
MCHC: 31.3 g/dL (ref 30.0–36.0)
MCV: 81.8 fl (ref 78.0–100.0)
Platelets: 191 10*3/uL (ref 150.0–400.0)
RBC: 4.83 Mil/uL (ref 3.87–5.11)
RDW: 13.8 % (ref 11.5–15.5)
WBC: 7.7 10*3/uL (ref 4.0–10.5)

## 2021-07-09 LAB — COMPREHENSIVE METABOLIC PANEL
ALT: 37 U/L — ABNORMAL HIGH (ref 0–35)
AST: 25 U/L (ref 0–37)
Albumin: 4.4 g/dL (ref 3.5–5.2)
Alkaline Phosphatase: 58 U/L (ref 39–117)
BUN: 8 mg/dL (ref 6–23)
CO2: 28 mEq/L (ref 19–32)
Calcium: 9.4 mg/dL (ref 8.4–10.5)
Chloride: 105 mEq/L (ref 96–112)
Creatinine, Ser: 0.62 mg/dL (ref 0.40–1.20)
GFR: 111.91 mL/min (ref >60.00)
Glucose, Bld: 77 mg/dL (ref 70–99)
Potassium: 4.1 mEq/L (ref 3.5–5.1)
Sodium: 140 mEq/L (ref 135–145)
Total Bilirubin: 1.3 mg/dL — ABNORMAL HIGH (ref 0.2–1.2)
Total Protein: 7 g/dL (ref 6.0–8.3)

## 2021-07-09 LAB — VITAMIN D 25 HYDROXY (VIT D DEFICIENCY, FRACTURES): VITD: 28.67 ng/mL — ABNORMAL LOW (ref 30.00–100.00)

## 2021-07-09 LAB — VITAMIN B12: Vitamin B-12: 660 pg/mL (ref 211–911)

## 2021-07-09 LAB — TSH: TSH: 2.76 u[IU]/mL (ref 0.35–5.50)

## 2021-07-09 NOTE — Progress Notes (Signed)
Please inform patient of the following:  Vitamin D was on low side but everything else is NORMAL. This could explain some of her symptoms. Recommend she take 1000-2000IU of vitamin D daily and we can recheck in 3-6 months.

## 2021-07-14 ENCOUNTER — Telehealth: Payer: Self-pay

## 2021-07-14 NOTE — Telephone Encounter (Signed)
Patient was returning a call from [place].

## 2021-07-15 NOTE — Telephone Encounter (Signed)
See results note. 

## 2021-07-15 NOTE — Telephone Encounter (Signed)
Pt called back. °

## 2022-03-22 ENCOUNTER — Ambulatory Visit: Payer: No Typology Code available for payment source | Admitting: Physician Assistant

## 2022-03-24 ENCOUNTER — Encounter: Payer: Self-pay | Admitting: Family

## 2022-03-24 ENCOUNTER — Ambulatory Visit (INDEPENDENT_AMBULATORY_CARE_PROVIDER_SITE_OTHER): Payer: No Typology Code available for payment source | Admitting: Family

## 2022-03-24 VITALS — BP 94/60 | HR 64 | Temp 98.1°F | Ht 61.0 in | Wt 120.2 lb

## 2022-03-24 DIAGNOSIS — H6502 Acute serous otitis media, left ear: Secondary | ICD-10-CM | POA: Diagnosis not present

## 2022-03-24 DIAGNOSIS — R0981 Nasal congestion: Secondary | ICD-10-CM | POA: Diagnosis not present

## 2022-03-24 MED ORDER — NOREL AD 4-10-325 MG PO TABS: 1.0000 | ORAL_TABLET | Freq: Two times a day (BID) | ORAL | 0 refills | Status: DC

## 2022-03-24 MED ORDER — AMOXICILLIN 500 MG PO CAPS: 1000.0000 mg | ORAL_CAPSULE | Freq: Three times a day (TID) | ORAL | 0 refills | Status: DC

## 2022-03-24 NOTE — Progress Notes (Signed)
Subjective:     Patient ID: Andrea [name], female    DOB: [birth year], 41 y.o.   MRN: [id number]  Chief Complaint  Patient presents with   Sore Throat    Pt c/o burning sore throat, dizziness and right ear pain/fullness. Present for about a week. [name] tried mucinex and zyrtec, helped temporarily.   HPI: URI with Ear pain - pt describes a 7 day history of sore throat, cough, and sinus sx.  Right ear pain with fullness, pressure, started about 4-5 days ago, and dizziness associated with head movements which [name] been present for 3 days. Denies associated hearing loss or discharge. Mild fever a few days ago that is improved. She [name] been taking Mucinex and Zyrtec with mild relief.   Assessment & Plan:   Problem List Items Addressed This Visit   None Visit Diagnoses     Non-recurrent acute serous otitis media of left ear    -  Primary pt having referred pain in right ear. Sending AMOX, advised on use & SE. OK to take Tylenol or Ibuprofen tid prn for pain.     Relevant Medications   amoxicillin (AMOXIL) 500 MG capsule   Sinus congestion    - provided Norel AD samples for sx, advised on use & SE, drink plenty of water, call office if sx are not improving.    Relevant Medications   Chlorphen-PE-Acetaminophen (NOREL AD) 4-10-325 MG TABS      Outpatient Medications Prior to Visit  Medication Sig Dispense Refill   acetaminophen (TYLENOL) 500 MG tablet Take 2 tablets (1,000 mg total) by mouth every 6 (six) hours. 60 tablet 0   azelastine (ASTELIN) 0.1 % nasal spray Place 2 sprays into both nostrils 2 (two) times daily. 30 mL 12   coconut oil OIL Apply 1 application topically as needed.  0   ibuprofen (ADVIL) 600 MG tablet Take 1 tablet (600 mg total) by mouth every 6 (six) hours. 30 tablet 0   levonorgestrel (MIRENA, 52 MG,) 20 MCG/DAY IUD Mirena 21 mcg/24 hours (8 yrs) 52 mg intrauterine device  Take 1 device by intrauterine route.     Prenatal Vit-Fe Fumarate-FA (PRENATAL MULTIVITAMIN)  TABS tablet Take 1 tablet by mouth daily at 12 noon.     senna-docusate (SENOKOT-S) 8.6-50 MG tablet Take 2 tablets by mouth at bedtime as needed for mild constipation. 30 tablet 0   No facility-administered medications prior to visit.    Past Medical History:  Diagnosis Date   Blood transfusion without reported diagnosis    History of postpartum hemorrhage     Past Surgical History:  Procedure Laterality Date   CESAREAN SECTION N/A 02/27/2021   Procedure: CESAREAN SECTION;  Surgeon: Azucena Fallen, MD;  Location: [facility];  Service: Obstetrics;  Laterality: N/A;    No Known Allergies     Objective:    Physical Exam Vitals and nursing note reviewed.  Constitutional:      Appearance: Normal appearance.  HENT:     Right Ear: Tympanic membrane and ear canal normal.     Left Ear: No drainage. A middle ear effusion is present. Tympanic membrane is injected, erythematous and retracted.  Cardiovascular:     Rate and Rhythm: Normal rate and regular rhythm.  Pulmonary:     Effort: Pulmonary effort is normal.     Breath sounds: Normal breath sounds.  Musculoskeletal:        General: Normal range of motion.  Skin:    General: Skin is  warm and dry.  Neurological:     Mental Status: She is alert.  Psychiatric:        Mood and Affect: Mood normal.        Behavior: Behavior normal.     BP 94/60 (BP Location: Left Arm, Patient Position: Sitting, Cuff Size: Normal)   Pulse 64   Temp 98.1 F (36.7 C) (Temporal)   Ht '5\' 1"'$  (1.549 m)   Wt 120 lb 4 oz (54.5 kg)   LMP  (LMP Unknown)   SpO2 99%   Breastfeeding No   BMI 22.72 kg/m  Wt Readings from Last 3 Encounters:  03/24/22 120 lb 4 oz (54.5 kg)  07/08/21 125 lb 14.4 oz (57.1 kg)  02/27/21 143 lb 4.8 oz (65 kg)        Meds ordered this encounter  Medications   amoxicillin (AMOXIL) 500 MG capsule    Sig: Take 2 capsules (1,000 mg total) by mouth 3 (three) times daily.    Dispense:  42 capsule    Refill:  0    Order  Specific Question:   Supervising Provider    Answer:   ANDY, CAMILLE L [[id number]]   Chlorphen-PE-Acetaminophen (NOREL AD) 4-10-325 MG TABS    Sig: Take 1 tablet by mouth in the morning and at bedtime.    Dispense:  6 tablet    Refill:  0    Order Specific Question:   Supervising Provider    Answer:   ANDY, CAMILLE L [[id number]]    Jeanie Sewer, NP

## 2022-03-24 NOTE — Patient Instructions (Addendum)
It was very nice to see you today!   I have sent  amoxicillin antibiotic for your left ear infection, take as directed, after eating. Okay to continue your over-the-counter medications, and Tylenol or ibuprofen as needed for pain or fever. You were also given samples of Norel AD which will help with your sinus symptoms take 1 pill twice a day. Get plenty of water!     PLEASE NOTE:  If you had any lab tests please let us know if you have not heard back within a few days. You may see your results on MyChart before we have a chance to review them but we will give you a call once they are reviewed by [place]. If we ordered any referrals today, please let us know if you have not heard from their office within the next week.

## 2022-03-31 ENCOUNTER — Ambulatory Visit: Payer: No Typology Code available for payment source | Admitting: Physician Assistant

## 2022-04-06 ENCOUNTER — Other Ambulatory Visit: Payer: Self-pay | Admitting: Gastroenterology

## 2022-04-06 DIAGNOSIS — R7989 Other specified abnormal findings of blood chemistry: Secondary | ICD-10-CM

## 2022-04-08 ENCOUNTER — Ambulatory Visit
Admission: RE | Admit: 2022-04-08 | Discharge: 2022-04-08 | Disposition: A | Discharge status: Home / Self Care | Payer: No Typology Code available for payment source | Source: Ambulatory Visit | Attending: Gastroenterology | Admitting: Gastroenterology

## 2022-04-08 DIAGNOSIS — R7989 Other specified abnormal findings of blood chemistry: Secondary | ICD-10-CM

## 2022-06-27 ENCOUNTER — Encounter: Payer: Self-pay | Admitting: *Deleted

## 2022-09-15 ENCOUNTER — Encounter: Payer: Self-pay | Admitting: *Deleted

## 2022-11-08 ENCOUNTER — Encounter: Payer: Self-pay | Admitting: Family Medicine

## 2022-11-08 ENCOUNTER — Ambulatory Visit: Payer: No Typology Code available for payment source | Admitting: Family Medicine

## 2022-11-08 VITALS — BP 100/60 | HR 71 | Temp 97.8°F | Ht 61.0 in | Wt 118.2 lb

## 2022-11-08 DIAGNOSIS — M545 Low back pain, unspecified: Secondary | ICD-10-CM

## 2022-11-08 DIAGNOSIS — Z13220 Encounter for screening for lipoid disorders: Secondary | ICD-10-CM | POA: Diagnosis not present

## 2022-11-08 DIAGNOSIS — Z131 Encounter for screening for diabetes mellitus: Secondary | ICD-10-CM | POA: Diagnosis not present

## 2022-11-08 LAB — CBC
HCT: 39.6 % (ref 36.0–46.0)
Hemoglobin: 12.9 g/dL (ref 12.0–15.0)
MCHC: 32.7 g/dL (ref 30.0–36.0)
MCV: 82.4 fl (ref 78.0–100.0)
Platelets: 208 10*3/uL (ref 150.0–400.0)
RBC: 4.8 Mil/uL (ref 3.87–5.11)
RDW: 12.6 % (ref 11.5–15.5)
WBC: 7.1 10*3/uL (ref 4.0–10.5)

## 2022-11-08 LAB — COMPREHENSIVE METABOLIC PANEL
ALT: 50 U/L — ABNORMAL HIGH (ref 0–35)
AST: 33 U/L (ref 0–37)
Albumin: 4.5 g/dL (ref 3.5–5.2)
Alkaline Phosphatase: 47 U/L (ref 39–117)
BUN: 10 mg/dL (ref 6–23)
CO2: 22 mEq/L (ref 19–32)
Calcium: 9.1 mg/dL (ref 8.4–10.5)
Chloride: 108 mEq/L (ref 96–112)
Creatinine, Ser: 0.64 mg/dL (ref 0.40–1.20)
GFR: 110.02 mL/min (ref >60.00)
Glucose, Bld: 93 mg/dL (ref 70–99)
Potassium: 3.8 mEq/L (ref 3.5–5.1)
Sodium: 141 mEq/L (ref 135–145)
Total Bilirubin: 1.6 mg/dL — ABNORMAL HIGH (ref 0.2–1.2)
Total Protein: 6.8 g/dL (ref 6.0–8.3)

## 2022-11-08 LAB — LIPID PANEL
Cholesterol: 145 mg/dL (ref 0–200)
HDL: 50 mg/dL (ref >39.00)
LDL Cholesterol: 76 mg/dL (ref 0–99)
NonHDL: 94.58
Total CHOL/HDL Ratio: 3
Triglycerides: 93 mg/dL (ref 0.0–149.0)
VLDL: 18.6 mg/dL (ref 0.0–40.0)

## 2022-11-08 LAB — TSH: TSH: 4.34 u[IU]/mL (ref 0.35–5.50)

## 2022-11-08 LAB — HEMOGLOBIN A1C: Hgb A1c MFr Bld: 5 % (ref 4.6–6.5)

## 2022-11-08 MED ORDER — MELOXICAM 15 MG PO TABS: 15.0000 mg | ORAL_TABLET | Freq: Every day | ORAL | 0 refills | Status: DC

## 2022-11-08 MED ORDER — CYCLOBENZAPRINE HCL 10 MG PO TABS: 10.0000 mg | ORAL_TABLET | Freq: Three times a day (TID) | ORAL | 0 refills | Status: DC | PRN

## 2022-11-08 NOTE — Patient Instructions (Signed)
It was very nice to see you today!  I think you have a strain in your lower back.  Please start the medications.  Work on the exercises.  Let us know if not improving.  We will check your blood work today.  Take care, Dr Jerline Pain  PLEASE NOTE:  If you had any lab tests, please let us know if you have not heard back within a few days. You may see your results on mychart before we have a chance to review them but we will give you a call once they are reviewed by [place].   If we ordered any referrals today, please let us know if you have not heard from their office within the next week.   If you had any urgent prescriptions sent in today, please check with the pharmacy within an hour of our visit to make sure the prescription was transmitted appropriately.   Please try these tips to maintain a healthy lifestyle:  Eat at least 3 REAL meals and 1-2 snacks per day.  Aim for no more than 5 hours between eating.  If you eat breakfast, please do so within one hour of getting up.   Each meal should contain half fruits/vegetables, one quarter protein, and one quarter carbs (no bigger than a computer mouse)  Cut down on sweet beverages. This includes juice, soda, and sweet tea.   Drink at least 1 glass of water with each meal and aim for at least 8 glasses per day  Exercise at least 150 minutes every week.
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
## 2022-11-10 NOTE — Progress Notes (Signed)
Please inform patient of the following:  Her liver numbers were up a little bit compared to last time we checked.  All the other labs are stable.  Please have her come back to recheck c-Met to make sure her liver numbers are stable.  We can recheck everything else in a year.

## 2022-11-16 ENCOUNTER — Other Ambulatory Visit: Payer: Self-pay

## 2022-11-16 DIAGNOSIS — R748 Abnormal levels of other serum enzymes: Secondary | ICD-10-CM

## 2023-12-28 ENCOUNTER — Encounter: Payer: Self-pay | Admitting: Family Medicine

## 2023-12-28 ENCOUNTER — Ambulatory Visit (INDEPENDENT_AMBULATORY_CARE_PROVIDER_SITE_OTHER): Payer: Self-pay | Admitting: Family Medicine

## 2023-12-28 VITALS — BP 103/66 | HR 67 | Temp 97.9°F | Ht 61.0 in | Wt 120.4 lb

## 2023-12-28 DIAGNOSIS — Z1159 Encounter for screening for other viral diseases: Secondary | ICD-10-CM

## 2023-12-28 DIAGNOSIS — Z13220 Encounter for screening for lipoid disorders: Secondary | ICD-10-CM | POA: Diagnosis not present

## 2023-12-28 DIAGNOSIS — G5622 Lesion of ulnar nerve, left upper limb: Secondary | ICD-10-CM | POA: Diagnosis not present

## 2023-12-28 DIAGNOSIS — R42 Dizziness and giddiness: Secondary | ICD-10-CM

## 2023-12-28 DIAGNOSIS — Z0001 Encounter for general adult medical examination with abnormal findings: Secondary | ICD-10-CM

## 2023-12-28 DIAGNOSIS — M545 Low back pain, unspecified: Secondary | ICD-10-CM

## 2023-12-28 DIAGNOSIS — M542 Cervicalgia: Secondary | ICD-10-CM | POA: Diagnosis not present

## 2023-12-28 DIAGNOSIS — Z131 Encounter for screening for diabetes mellitus: Secondary | ICD-10-CM

## 2023-12-28 DIAGNOSIS — D179 Benign lipomatous neoplasm, unspecified: Secondary | ICD-10-CM

## 2023-12-28 LAB — COMPREHENSIVE METABOLIC PANEL WITH GFR
ALT: 70 U/L — ABNORMAL HIGH (ref 0–35)
AST: 31 U/L (ref 0–37)
Albumin: 4.4 g/dL (ref 3.5–5.2)
Alkaline Phosphatase: 49 U/L (ref 39–117)
BUN: 8 mg/dL (ref 6–23)
CO2: 28 meq/L (ref 19–32)
Calcium: 9.3 mg/dL (ref 8.4–10.5)
Chloride: 104 meq/L (ref 96–112)
Creatinine, Ser: 0.65 mg/dL (ref 0.40–1.20)
GFR: 108.74 mL/min (ref >60.00)
Glucose, Bld: 95 mg/dL (ref 70–99)
Potassium: 3.9 meq/L (ref 3.5–5.1)
Sodium: 138 meq/L (ref 135–145)
Total Bilirubin: 2.4 mg/dL — ABNORMAL HIGH (ref 0.2–1.2)
Total Protein: 7.1 g/dL (ref 6.0–8.3)

## 2023-12-28 LAB — LIPID PANEL
Cholesterol: 186 mg/dL (ref 0–200)
HDL: 58 mg/dL (ref >39.00)
LDL Cholesterol: 114 mg/dL — ABNORMAL HIGH (ref 0–99)
NonHDL: 127.71
Total CHOL/HDL Ratio: 3
Triglycerides: 67 mg/dL (ref 0.0–149.0)
VLDL: 13.4 mg/dL (ref 0.0–40.0)

## 2023-12-28 LAB — CBC
HCT: 42 % (ref 36.0–46.0)
Hemoglobin: 13.6 g/dL (ref 12.0–15.0)
MCHC: 32.4 g/dL (ref 30.0–36.0)
MCV: 83.5 fl (ref 78.0–100.0)
Platelets: 205 10*3/uL (ref 150.0–400.0)
RBC: 5.03 Mil/uL (ref 3.87–5.11)
RDW: 13.3 % (ref 11.5–15.5)
WBC: 7 10*3/uL (ref 4.0–10.5)

## 2023-12-28 LAB — URINALYSIS, ROUTINE W REFLEX MICROSCOPIC
Bilirubin Urine: NEGATIVE
Hgb urine dipstick: NEGATIVE
Ketones, ur: NEGATIVE
Leukocytes,Ua: NEGATIVE
Nitrite: NEGATIVE
RBC / HPF: NONE SEEN (ref 0–?)
Specific Gravity, Urine: 1.015 (ref 1.000–1.030)
Total Protein, Urine: NEGATIVE
Urine Glucose: NEGATIVE
Urobilinogen, UA: 0.2 (ref 0.0–1.0)
WBC, UA: NONE SEEN (ref 0–?)
pH: 7 (ref 5.0–8.0)

## 2023-12-28 LAB — HEMOGLOBIN A1C: Hgb A1c MFr Bld: 5.2 % (ref 4.6–6.5)

## 2023-12-28 NOTE — Progress Notes (Signed)
 Chief Complaint:  Andrea [name] is a 43 y.o. female who presents today for her annual comprehensive physical exam.    Assessment/Plan:  New/Acute Problems: Neck Pain  No red flags.  Likely muscular strain based on exam.  We will refer to physical therapy.  She will let us know if not improving in the next couple of weeks and we can refer to sports medicine at that time.  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  Will be placing referral to physical therapy as above.  If not improving will refer to sports medicine  Low back pain with sciatica No red flags.  Overall reassuring exam though does have bilateral positive straight leg raise.  Will be placing referral to physical therapy as above.  We can refer to sports medicine if not improving  Dizziness No red flags.  Overall reassuring exam.  No current symptoms.  Symptoms are postural in nature-likely BPPV.  Dix-Hallpike was deferred today as we addressed her other about more pressing concerns.  We will check labs today including CBC, c-Met, TSH, and B12.  If labs are normal would consider referral to vestibular rehab.  Given that her exam today is reassuring and symptoms been going on for several months do not think we need to obtain urgent imaging at this time however if symptoms persist we will pursue this further.  We discussed reasons to return to care or seek emergent care.  Chronic Problems Addressed Today: Lipoma She occasionally has pain with a lipoma on her left abdomen though overall manageable.  We did discuss referral to surgery however she declined.  She will let us know if symptoms change or if she changes her mind.  Preventative Healthcare: Check labs.  Flu vaccine declined.  Up-to-date on Pap per GYN.  Patient Counseling(The following topics were reviewed and/or handout was given):  -Nutrition: Stressed importance of moderation in sodium/caffeine intake, saturated fat and cholesterol, caloric balance, sufficient  intake of fresh fruits, vegetables, and fiber.  -Stressed the importance of regular exercise.   -Substance Abuse: Discussed cessation/primary prevention of tobacco, alcohol, or other drug use; driving or other dangerous activities under the influence; availability of treatment for abuse.   -Injury prevention: Discussed safety belts, safety helmets, smoke detector, smoking near bedding or upholstery.   -Sexuality: Discussed sexually transmitted diseases, partner selection, use of condoms, avoidance of unintended pregnancy and contraceptive alternatives.   -Dental health: Discussed importance of regular tooth brushing, flossing, and dental visits.  -Health maintenance and immunizations reviewed. Please refer to Health maintenance section.  Return to care in 1 year for next preventative visit.     Subjective:  HPI:  See Assessment / plan for status of chronic conditions.  She has a few issues that she would like to discuss today.  She has had ongoing issues with upper back pain and low neck pain.  This has been going on for several months.  She has not tried any medications for this though Biofreeze does seem to help.  No stretches or exercises have been tried.  No obvious aggravating or precipitating events.  Symptoms do seem to improve with rest.  She has also had ongoing intermittent issue with dizziness when standing up over the last few months.  She does not have any symptoms at rest but only when standing up quickly.  Symptoms usually last for a few moments and then subside.  Usually feels like a room spinning sensation.  She has not had any passing out episodes.  She thinks that she is getting plenty of fluids and does not think that she is dehydrated.  No weakness or numbness.  She has also been having intermittent issues with pins-and-needles in her left arm near her pinky.  This is also been going on for several months.  Symptoms seem to come and go.  She has had ongoing pain in her  lower back with pins-and-needles sensation radiating into both of her feet bilaterally.  This is worse with certain motions.  No reported bowel or bladder incontinence.  Lifestyle Diet: Balanced. Plenty of fruits and vegetables. Exercise: Nothing specific.      12/28/2023    8:21 AM  Depression screen PHQ 2/9  Decreased Interest 0  Down, Depressed, Hopeless 0  PHQ - 2 Score 0    Health Maintenance Due  Topic Date Due   Hepatitis C Screening  Never done   Cervical Cancer Screening (HPV/Pap Cotest)  Never done     ROS: Per HPI, otherwise a complete review of systems was negative.   PMH:  The following were reviewed and entered/updated in epic: Past Medical History:  Diagnosis Date   Blood transfusion without reported diagnosis    History of postpartum hemorrhage    Patient Active Problem List   Diagnosis Date Noted   Lipoma 07/08/2021   Sore throat 07/08/2021   Epigastric abdominal pain 07/08/2021   Past Surgical History:  Procedure Laterality Date   CESAREAN SECTION N/A 02/27/2021   Procedure: CESAREAN SECTION;  Surgeon: Shea Evans, MD;  Location: [facility];  Service: Obstetrics;  Laterality: N/A;    Family History  Problem Relation Age of Onset   Hypertension Mother    Thyroid disease Mother    Glaucoma Mother    Cancer Maternal Grandfather     Medications- reviewed and updated Current Outpatient Medications  Medication Sig Dispense Refill   acetaminophen (TYLENOL) 500 MG tablet Take 2 tablets (1,000 mg total) by mouth every 6 (six) hours. 60 tablet 0   azelastine (ASTELIN) 0.1 % nasal spray Place 2 sprays into both nostrils 2 (two) times daily. 30 mL 12   coconut oil OIL Apply 1 application topically as needed.  0   ibuprofen (ADVIL) 600 MG tablet Take 1 tablet (600 mg total) by mouth every 6 (six) hours. 30 tablet 0   No current facility-administered medications for this visit.    Allergies-reviewed and updated No Known Allergies  Social History    Socioeconomic History   Marital status: Married    Spouse name: Not on file   Number of children: Not on file   Years of education: Not on file   Highest education level: Bachelor's degree (e.g., BA, AB, BS)  Occupational History   Not on file  Tobacco Use   Smoking status: Never   Smokeless tobacco: Never  Substance and Sexual Activity   Alcohol use: Not Currently   Drug use: Never   Sexual activity: Yes  Other Topics Concern   Not on file  Social History Narrative   Not on file   Social Drivers of Health   Financial Resource Strain: Low Risk  (12/26/2023)   Overall Financial Resource Strain (CARDIA)    Difficulty of Paying Living Expenses: Not hard at all  Food Insecurity: No Food Insecurity (12/26/2023)   Hunger Vital Sign    Worried About Running Out of Food in the Last Year: Never true    Ran Out of Food in the Last Year: Never true  Transportation  Needs: No Transportation Needs (12/26/2023)   PRAPARE - Administrator, Civil Service (Medical): No    Lack of Transportation (Non-Medical): No  Physical Activity: Insufficiently Active (12/26/2023)   Exercise Vital Sign    Days of Exercise per Week: 2 days    Minutes of Exercise per Session: 10 min  Stress: No Stress Concern Present (12/26/2023)   [employer] of Occupational Health - Occupational Stress Questionnaire    Feeling of Stress : Not at all  Social Connections: Moderately Integrated (12/26/2023)   Social Connection and Isolation Panel [NHANES]    Frequency of Communication with Friends and Family: More than three times a week    Frequency of Social Gatherings with Friends and Family: Once a week    Attends Religious Services: More than 4 times per year    Active Member of [employer] or Organizations: No    Attends Engineer, structural: Not on file    Marital Status: Married        Objective:  Physical Exam: BP 103/66   Pulse 67   Temp 97.9 F (36.6 C) (Temporal)   Ht 5\' 1"  (1.549  m)   Wt 120 lb 6.4 oz (54.6 kg)   SpO2 100%   BMI 22.75 kg/m   Body mass index is 22.75 kg/m. Wt Readings from Last 3 Encounters:  12/28/23 120 lb 6.4 oz (54.6 kg)  11/08/22 118 lb 4 oz (53.6 kg)  03/24/22 120 lb 4 oz (54.5 kg)   Gen: NAD, resting comfortably HEENT: TMs normal bilaterally. OP clear. No thyromegaly noted.  CV: RRR with no murmurs appreciated Pulm: NWOB, CTAB with no crackles, wheezes, or rhonchi GI: Normal bowel sounds present. Soft, Nontender, Nondistended. MSK: no edema, cyanosis, or clubbing noted - Neck: No deformities.  Full range of motion.  Tenderness to palpation along lower cervical paraspinal muscles - Left arm: No deformities.  Full range of motion throughout.  Neurovascular intact distally.  Tinel sign positive at left medial epicondyle. - Back: No deformities.  Tender to palpation along low back.   - Legs: No deformities.  Sensation to light touch intact throughout.  Strength 5 out of 5 throughout.  Straight leg raise positive bilaterally. Skin: warm, dry Neuro: CN2-12 grossly intact. Strength 5/5 in upper and lower extremities. Reflexes symmetric and intact bilaterally.  Psych: Normal affect and thought content     Selwyn Reason M. Jimmey Ralph, MD 12/28/2023 9:00 AM

## 2023-12-28 NOTE — Patient Instructions (Addendum)
 It was very nice to see you today!  I think the pain in your back is probably due to a tight muscle.  We will refer you to see physical therapy.  They can also help with a pinched nerve in your arm and lower back.  I think you may have vertigo.  Will check blood work today.  Please continue to work on diet and exercise.  Return in about 1 year (around 12/27/2024) for Annual Physical.   Take care, Dr Jimmey Ralph  PLEASE NOTE:  If you had any lab tests, please let us know if you have not heard back within a few days. You may see your results on mychart before we have a chance to review them but we will give you a call once they are reviewed by [place].   If we ordered any referrals today, please let us know if you have not heard from their office within the next week.   If you had any urgent prescriptions sent in today, please check with the pharmacy within an hour of our visit to make sure the prescription was transmitted appropriately.   Please try these tips to maintain a healthy lifestyle:  Eat at least 3 REAL meals and 1-2 snacks per day.  Aim for no more than 5 hours between eating.  If you eat breakfast, please do so within one hour of getting up.   Each meal should contain half fruits/vegetables, one quarter protein, and one quarter carbs (no bigger than a computer mouse)  Cut down on sweet beverages. This includes juice, soda, and sweet tea.   Drink at least 1 glass of water with each meal and aim for at least 8 glasses per day  Exercise at least 150 minutes every week.    Preventive Care 42-28 Years Old, Female Preventive care refers to lifestyle choices and visits with your health care provider that can promote health and wellness. Preventive care visits are also called wellness exams. What can I expect for my preventive care visit? Counseling Your health care provider may ask you questions about your: Medical history, including: Past medical problems. Family medical  history. Pregnancy history. Current health, including: Menstrual cycle. Method of birth control. Emotional well-being. Home life and relationship well-being. Sexual activity and sexual health. Lifestyle, including: Alcohol, nicotine or tobacco, and drug use. Access to firearms. Diet, exercise, and sleep habits. Work and work Astronomer. Sunscreen use. Safety issues such as seatbelt and bike helmet use. Physical exam Your health care provider will check your: Height and weight. These may be used to calculate your BMI (body mass index). BMI is a measurement that tells if you are at a healthy weight. Waist circumference. This measures the distance around your waistline. This measurement also tells if you are at a healthy weight and may help predict your risk of certain diseases, such as type 2 diabetes and high blood pressure. Heart rate and blood pressure. Body temperature. Skin for abnormal spots. What immunizations do I need?  Vaccines are usually given at various ages, according to a schedule. Your health care provider will recommend vaccines for you based on your age, medical history, and lifestyle or other factors, such as travel or where you work. What tests do I need? Screening Your health care provider may recommend screening tests for certain conditions. This may include: Lipid and cholesterol levels. Diabetes screening. This is done by checking your blood sugar (glucose) after you have not eaten for a while (fasting). Pelvic exam and Pap  test. Hepatitis B test. Hepatitis C test. HIV (human immunodeficiency virus) test. STI (sexually transmitted infection) testing, if you are at risk. Lung cancer screening. Colorectal cancer screening. Mammogram. Talk with your health care provider about when you should start having regular mammograms. This may depend on whether you have a family history of breast cancer. BRCA-related cancer screening. This may be done if you have a  family history of breast, ovarian, tubal, or peritoneal cancers. Bone density scan. This is done to screen for osteoporosis. Talk with your health care provider about your test results, treatment options, and if necessary, the need for more tests. Follow these instructions at home: Eating and drinking  Eat a diet that includes fresh fruits and vegetables, whole grains, lean protein, and low-fat dairy products. Take vitamin and mineral supplements as recommended by your health care provider. Do not drink alcohol if: Your health care provider tells you not to drink. You are pregnant, may be pregnant, or are planning to become pregnant. If you drink alcohol: Limit how much you have to 0-1 drink a day. Know how much alcohol is in your drink. In the [place] one drink equals one 12 oz bottle of beer (355 mL), one 5 oz glass of wine (148 mL), or one 1 oz glass of hard liquor (44 mL). Lifestyle Brush your teeth every morning and night with fluoride toothpaste. Floss one time each day. Exercise for at least 30 minutes 5 or more days each week. Do not use any products that contain nicotine or tobacco. These products include cigarettes, chewing tobacco, and vaping devices, such as e-cigarettes. If you need help quitting, ask your health care provider. Do not use drugs. If you are sexually active, practice safe sex. Use a condom or other form of protection to prevent STIs. If you do not wish to become pregnant, use a form of birth control. If you plan to become pregnant, see your health care provider for a prepregnancy visit. Take aspirin only as told by your health care provider. Make sure that you understand how much to take and what form to take. Work with your health care provider to find out whether it is safe and beneficial for you to take aspirin daily. Find healthy ways to manage stress, such as: Meditation, yoga, or listening to music. Journaling. Talking to a trusted person. Spending time with  friends and family. Minimize exposure to UV radiation to reduce your risk of skin cancer. Safety Always wear your seat belt while driving or riding in a vehicle. Do not drive: If you have been drinking alcohol. Do not ride with someone who has been drinking. When you are tired or distracted. While texting. If you have been using any mind-altering substances or drugs. Wear a helmet and other protective equipment during sports activities. If you have firearms in your house, make sure you follow all gun safety procedures. Seek help if you have been physically or sexually abused. What's next? Visit your health care provider once a year for an annual wellness visit. Ask your health care provider how often you should have your eyes and teeth checked. Stay up to date on all vaccines. This information is not intended to replace advice given to you by your health care provider. Make sure you discuss any questions you have with your health care provider. Document Revised: 03/17/2021 Document Reviewed: 03/17/2021 Elsevier Patient Education  2024 [employer].

## 2023-12-28 NOTE — Assessment & Plan Note (Signed)
 She occasionally has pain with a lipoma on her left abdomen though overall manageable.  We did discuss referral to surgery however she declined.  She will let us know if symptoms change or if she changes her mind.

## 2023-12-29 LAB — TSH: TSH: 4.38 u[IU]/mL (ref 0.35–5.50)

## 2023-12-29 LAB — HEPATITIS C ANTIBODY: Hepatitis C Ab: NONREACTIVE

## 2023-12-29 LAB — VITAMIN B12: Vitamin B-12: 417 pg/mL (ref 211–911)

## 2024-01-02 ENCOUNTER — Encounter: Payer: Self-pay | Admitting: Family Medicine

## 2024-01-02 NOTE — Progress Notes (Signed)
 One of her liver numbers was a little elevated.  Recommend she come back here to recheck.  Please place future order for CM[facility].  Cholesterol still mildly elevated.  Do not need to start meds for this however she should continue to work on diet and exercise and we can recheck again in a year or so.  The rest of her labs are all normal.  We can recheck everything else in year or so.  Recommend she continue to work on diet and exercise.  Recommend she let us know if her neck, arm, and back pain are not improving and we can refer to sports medicine.  We can also refer her to vestibular rehab if she is still having issues with dizziness.

## 2024-01-03 ENCOUNTER — Other Ambulatory Visit: Payer: Self-pay | Admitting: *Deleted

## 2024-01-03 DIAGNOSIS — R748 Abnormal levels of other serum enzymes: Secondary | ICD-10-CM

## 2024-01-15 ENCOUNTER — Other Ambulatory Visit

## 2024-02-05 ENCOUNTER — Other Ambulatory Visit (INDEPENDENT_AMBULATORY_CARE_PROVIDER_SITE_OTHER)

## 2024-02-05 DIAGNOSIS — R748 Abnormal levels of other serum enzymes: Secondary | ICD-10-CM

## 2024-02-05 LAB — COMPREHENSIVE METABOLIC PANEL WITH GFR
ALT: 55 U/L — ABNORMAL HIGH (ref 0–35)
AST: 29 U/L (ref 0–37)
Albumin: 4 g/dL (ref 3.5–5.2)
Alkaline Phosphatase: 37 U/L — ABNORMAL LOW (ref 39–117)
BUN: 8 mg/dL (ref 6–23)
CO2: 25 meq/L (ref 19–32)
Calcium: 8.9 mg/dL (ref 8.4–10.5)
Chloride: 106 meq/L (ref 96–112)
Creatinine, Ser: 0.63 mg/dL (ref 0.40–1.20)
GFR: 109.48 mL/min (ref >60.00)
Glucose, Bld: 100 mg/dL — ABNORMAL HIGH (ref 70–99)
Potassium: 3.6 meq/L (ref 3.5–5.1)
Sodium: 138 meq/L (ref 135–145)
Total Bilirubin: 1.6 mg/dL — ABNORMAL HIGH (ref 0.2–1.2)
Total Protein: 6.3 g/dL (ref 6.0–8.3)

## 2024-02-06 ENCOUNTER — Encounter: Payer: Self-pay | Admitting: Family Medicine

## 2024-02-06 NOTE — Progress Notes (Signed)
 Her liver numbers are improving.  Do not need to do any other testing at this point.  We can recheck again at next office visit.

## 2024-03-14 ENCOUNTER — Ambulatory Visit: Admitting: Physical Therapy

## 2024-04-10 ENCOUNTER — Ambulatory Visit (INDEPENDENT_AMBULATORY_CARE_PROVIDER_SITE_OTHER): Admitting: Physical Therapy

## 2024-04-10 DIAGNOSIS — M5412 Radiculopathy, cervical region: Secondary | ICD-10-CM | POA: Diagnosis not present

## 2024-04-10 DIAGNOSIS — M5416 Radiculopathy, lumbar region: Secondary | ICD-10-CM | POA: Diagnosis not present

## 2024-04-10 NOTE — Therapy (Unsigned)
 OUTPATIENT PHYSICAL THERAPY UPPER EXTREMITY EVALUATION   Patient Name: Andrea [name] MRN: [id number] DOB:31-Mar-1981, 43 y.o., female Today's Date: 04/10/2024  END OF SESSION:   Past Medical History:  Diagnosis Date   Blood transfusion without reported diagnosis    History of postpartum hemorrhage    Past Surgical History:  Procedure Laterality Date   CESAREAN SECTION N/A 02/27/2021   Procedure: CESAREAN SECTION;  Surgeon: Barbette Knock, MD;  Location: [facility]S;  Service: Obstetrics;  Laterality: N/A;   Patient Active Problem List   Diagnosis Date Noted   Lipoma 07/08/2021   Sore throat 07/08/2021   Epigastric abdominal pain 07/08/2021    PCP: ***  REFERRING PROVIDER: ***  REFERRING DIAG: ***  THERAPY DIAG:  No diagnosis found.  Rationale for Evaluation and Treatment: Rehabilitation  ONSET DATE: ***  SUBJECTIVE:                                                                                                                                                                                      SUBJECTIVE STATEMENT:  Neck pain, 4 months, no incident.  Tender points in lower neck.  Has tried heat pack.  Pain: was having some dizziness with neck pain.    Longer sitting, increased pain.  Pt does not work, but has pain with computer work at home.  Sometimes R UE tingly,  sometimes both hands numb or tingling into hands.  Back pain ?  Yes. In AM, sometimes into bil hips, more in R, into R toes.  Pain with walk.  Does not feel balance is off.  Exercise: trying to walk.   Hand dominance: Right   PERTINENT HISTORY: ***  PAIN:  Are you having pain? Yes: NPRS scale: up to 7 /10 Pain location: neck  Pain description: *** Aggravating factors: *** Relieving factors: ***  Are you having pain? Yes: NPRS scale: 6-7/10 Pain location: bil low back sides and center.  Pain description: *** Aggravating factors: sitting, walking.  Relieving factors: ***    PRECAUTIONS:  {Therapy precautions:24002}  RED FLAGS: None   WEIGHT BEARING RESTRICTIONS: No  FALLS:  Has patient fallen in last 6 months? {fallsyesno:27318}   PLOF: Independent  PATIENT GOALS: Decreased pain  NEXT MD VISIT:   OBJECTIVE:   DIAGNOSTIC FINDINGS:    PATIENT SURVEYS :  FOTO ***  COGNITION: Overall cognitive status: Within functional limits for tasks assessed     SENSATION: WFL  POSTURE:   UPPER EXTREMITY ROM:   Active  ROM Right eval Left eval  Shoulder flexion    Shoulder extension    Shoulder abduction    Shoulder adduction    Shoulder internal  rotation    Shoulder external rotation    Elbow flexion    Elbow extension    Wrist flexion    Wrist extension    Wrist ulnar deviation    Wrist radial deviation    Wrist pronation    Wrist supination    (Blank rows = not tested)  UPPER EXTREMITY MMT:  MMT Right eval Left eval  Shoulder flexion    Shoulder extension    Shoulder abduction    Shoulder adduction    Shoulder internal rotation    Shoulder external rotation    Middle trapezius    Lower trapezius    Elbow flexion    Elbow extension    Wrist flexion    Wrist extension    Wrist ulnar deviation    Wrist radial deviation    Wrist pronation    Wrist supination    Grip strength (lbs)    (Blank rows = not tested)  SHOULDER SPECIAL TESTS:   JOINT MOBILITY TESTING:  ***  PALPATION:  ***    TODAY'S TREATMENT:                                                                                                                                         DATE:  ***  PATIENT EDUCATION:  Education details: PT POC, Exam findings, HEP Person educated: Patient Education method: Programmer, multimedia, Demonstration, Tactile cues, Verbal cues, and Handouts Education comprehension: verbalized understanding, returned demonstration, verbal cues required, tactile cues required, and needs further education   HOME EXERCISE  PROGRAM: ***  ASSESSMENT:  CLINICAL IMPRESSION: Eval:  Patient presents with primary complaint of   Pt with decreased ability for full functional activities, reaching, lifting, carrying, and IADLs. Pt to benefit from skilled PT to improve deficits and pain.    OBJECTIVE IMPAIRMENTS: {opptimpairments:25111}.   ACTIVITY LIMITATIONS: {activitylimitations:27494}  PARTICIPATION LIMITATIONS: {participationrestrictions:25113}  PERSONAL FACTORS: {Personal factors:25162} are also affecting patient's functional outcome.   REHAB POTENTIAL: {rehabpotential:25112}  CLINICAL DECISION MAKING: {clinical decision making:25114}  EVALUATION COMPLEXITY: {Evaluation complexity:25115}  GOALS: Goals reviewed with patient? Yes   SHORT TERM GOALS: Target date: ***  Pt to be intendment with initial HEP  Goal status: INITIAL  2.  ***  Goal status: INITIAL    LONG TERM GOALS: Target date: ***  Pt to be independent with final HEP  Goal status: INITIAL  2.  Pt to demo improved ROM to be [facility] and pain free, to improve ability for ADLs.   Goal status: INITIAL  3.  Pt to demo improved strength to be at least 4/5, to improve ability for ***  Goal status: INITIAL  4.  ***  Goal status: INITIAL    PLAN: PT FREQUENCY: {rehab frequency:25116}  PT DURATION: {rehab duration:25117}  PLANNED INTERVENTIONS: Therapeutic exercises, Therapeutic activity, Neuromuscular re-education, Patient/Family education, Self Care, Joint mobilization, Joint manipulation, Stair training, DME instructions, Aquatic Therapy, Dry Needling, Electrical  stimulation, Cryotherapy, Moist heat, Taping, Ultrasound, Ionotophoresis 4mg /ml Dexamethasone , Manual therapy,  Vasopneumatic device, Traction, Spinal manipulation, Spinal mobilization,    PLAN FOR NEXT SESSION:    Tinnie Don, PT 04/10/2024, 10:25 AM

## 2024-04-11 ENCOUNTER — Encounter: Payer: Self-pay | Admitting: Physical Therapy

## 2024-05-16 ENCOUNTER — Encounter: Payer: Self-pay | Admitting: Physical Therapy

## 2024-05-16 ENCOUNTER — Ambulatory Visit (INDEPENDENT_AMBULATORY_CARE_PROVIDER_SITE_OTHER): Admitting: Physical Therapy

## 2024-05-16 DIAGNOSIS — M5416 Radiculopathy, lumbar region: Secondary | ICD-10-CM | POA: Diagnosis not present

## 2024-05-16 DIAGNOSIS — M5412 Radiculopathy, cervical region: Secondary | ICD-10-CM | POA: Diagnosis not present

## 2024-05-16 NOTE — Therapy (Signed)
 OUTPATIENT PHYSICAL THERAPY UPPER EXTREMITY TREATMENT    Patient Name: Andrea [name] MRN: [id number] DOB:26-Aug-1981, 43 y.o., female Today's Date: 05/16/2024   END OF SESSION:  PT End of Session - 05/16/24 0939     Visit Number 2    Number of Visits 16    Date for PT Re-Evaluation 06/05/24    Authorization Type [facility] multiplan    PT Start Time 0940    PT Stop Time 1018    PT Time Calculation (min) 38 min    Activity Tolerance Patient tolerated treatment well    Behavior During Therapy WFL for tasks assessed/performed          Past Medical History:  Diagnosis Date   Blood transfusion without reported diagnosis    History of postpartum hemorrhage    Past Surgical History:  Procedure Laterality Date   CESAREAN SECTION N/A 02/27/2021   Procedure: CESAREAN SECTION;  Surgeon: Barbette Knock, MD;  Location: [facility] LD [facility]S;  Service: Obstetrics;  Laterality: N/A;   Patient Active Problem List   Diagnosis Date Noted   Lipoma 07/08/2021   Sore throat 07/08/2021   Epigastric abdominal pain 07/08/2021    PCP: Worth Kitty  REFERRING PROVIDER: Worth Kitty  REFERRING DIAG: Neck pain, ulnar neruopathy L,  low back pain   THERAPY DIAG:  Radiculopathy, cervical region  Radiculopathy, lumbar region  Rationale for Evaluation and Treatment: Rehabilitation  ONSET DATE:   SUBJECTIVE:                                                                                                                                                                                      SUBJECTIVE STATEMENT: Pt 10 min late to appt. Last seen 1 month ago.  Tingling into L ulnar nerve. Randomly   and pain into R anterior and post shoulder.  Low back- still there, not better, not worse .  Pt states new onset of Neck pain for about  4 months, no incident to report.  She was having some dizziness with neck pain, but states no longer having this. She was also altering her diet at the time, and thinks she was not  eating enough. Pt does not work, but has pain with computer work at home. Does not exercise but tires to walk.   She notes tightness and pain in neck, and tingling/numbness into both hands at times, more in R.  Also has low back pain, with pain radiating into bil hips and thigh region, and down into toes on R. Back pain more in AM, and with sitting or prolonged standing. Neck more sore with computer or IADLs.  Her husband is a PT and  has tired massage, but pt states not improving pain.   Hand dominance: Right   PERTINENT HISTORY: N/a   PAIN:  Are you having pain? Yes: NPRS scale: up to 7 /10 Pain location: neck  Pain description: sore, painful Aggravating factors: movement, computer work Relieving factors: none stated   Are you having pain? Yes: NPRS scale: 6-7/10 Pain location: bil low back sides and center, into bil LEs, and into R foot  Pain description: sore, radiating  Aggravating factors: sitting, walking.  Relieving factors: none stated    PRECAUTIONS: None  RED FLAGS: None   WEIGHT BEARING RESTRICTIONS: No  FALLS:  Has patient fallen in last 6 months? No   PLOF: Independent  PATIENT GOALS: Decreased pain in back and neck.   NEXT MD VISIT:   OBJECTIVE:   DIAGNOSTIC FINDINGS:    PATIENT SURVEYS :    COGNITION: Overall cognitive status: Within functional limits for tasks assessed     SENSATION: WFL  POSTURE:   UPPER EXTREMITY ROM:  Neck: ROM: WFL , pain with rotation ,  Shoulders: WFL  Lumbar: flexion: mild limitation,  ext: wfl / pain,   SB: wfl   UPPER EXTREMITY MMT:  Hips: 4/5  Knee; 5/5   MMT Right eval Left eval  Shoulder flexion 4 4  Shoulder extension    Shoulder abduction 4 4  Shoulder adduction    Shoulder internal rotation    Shoulder external rotation 4 4  Middle trapezius    Lower trapezius    Elbow flexion    Elbow extension    Wrist flexion    Wrist extension    Wrist ulnar deviation    Wrist radial deviation     Wrist pronation    Wrist supination    Grip strength (lbs)    (Blank rows = not tested)   JOINT MOBILITY TESTING:   Other tests:  LAD R shoulder: pain Supine- shoulder protraction- painful Light cervical distraction- comfortable.  Prom for neck in supine- rotation- painful     PALPATION:  Neck: tender to palpate bil UT , mild in paraspinals, none in SO,  Very tender to light touch in mid t-spine and rhomboids, as well as lower L spine and SIJ.     TODAY'S TREATMENT:                                                                                                                                         DATE:  05/16/2024  Therapeutic Exercise: Aerobic: Supine:  ER butterfly for pec stretch x 15;  pec stretch with nerve glide- median 2 x 10 bil; SA reaches x 10 S/L: ER on R x 10, x 5 with 2lb (mild pain in neck)  Quadruped: SA presses 2 x 10 (challenging)  Seated:  ulnar nerve glide 2 x 10 on L;   ER rom bil x 12;   Standing:  scap squeeze x12 (soreness in levator on R)  Stretches:  Neuromuscular Re-education: Manual Therapy: Therapeutic Activity: Self Care:   PATIENT EDUCATION:  Education details: PT POC, Exam findings, HEP Person educated: Patient Education method: Explanation, Demonstration, Tactile cues, Verbal cues, and Handouts Education comprehension: verbalized understanding, returned demonstration, verbal cues required, tactile cues required, and needs further education   HOME EXERCISE PROGRAM: Access Code: [email or web address] URL: [email or web address] Date: 04/11/2024 Prepared by: Tinnie Don  Exercises - Supine Cervical Sidebending Stretch  - 2 x daily - 3 reps - 20 hold - Standing Scapular Retraction  - 1-2 x daily - 1 sets - 10 reps - Seated Correct Posture   ASSESSMENT:  CLINICAL IMPRESSION: Pt with slight improvement of tolerance for activity today. She does have some soreness with most activities. Noted neural tension in UEs today. L  symptoms consistent radiculopathy in  ulnar distribution, and R more of bicep tendonitis, scapular dysfunction/weakness. Pt challenged with most activities, but able to perform. Will perform more manual for R UT/levator tightness and traction next visit, and continue scapular work as able.   Eval: Patient presents with primary complaint of pain in neck and back. She has ongoing pain and tenderness in neck, with referred pain into UEs,  as well as pain in lumbar region with pain into bil LEs R>L. She has decreased ROM and ability for activity, IADLs and ADLs due to ongoing pain. She has lack of effective HEP for ongoing pain. She has increased pain with trial for some pain relief positions today.  Pt to benefit from skilled PT to improve deficits and pain.    OBJECTIVE IMPAIRMENTS: decreased activity tolerance, decreased mobility, decreased ROM, decreased strength, increased muscle spasms, impaired UE functional use, improper body mechanics, and pain.   ACTIVITY LIMITATIONS: carrying, lifting, bending, sitting, standing, squatting, stairs, and locomotion level  PARTICIPATION LIMITATIONS: meal prep, cleaning, driving, shopping, and community activity  PERSONAL FACTORS: Past/current experiences and Time since onset of injury/illness/exacerbation are also affecting patient's functional outcome.   REHAB POTENTIAL: Good  CLINICAL DECISION MAKING: Evolving/moderate complexity  EVALUATION COMPLEXITY: Moderate  GOALS: Goals reviewed with patient? Yes   SHORT TERM GOALS: Target date: 05/01/24  Pt to be intendment with initial HEP  Goal status: INITIAL  2.  Pt to report decreased pain in neck by 2 points.   Goal status: INITIAL    LONG TERM GOALS: Target date: 06/05/24  Pt to be independent with final HEP  Goal status: INITIAL  2.  Pt to demo improved ROM of neck and back to be [facility] and pain free, to improve ability for ADLs.   Goal status: INITIAL  3.  Pt to demo improved strength of  shoulders and hips/core to be wfl for pt age, to improve ability for IADLs.   Goal status: INITIAL  4.  Pt to report decreased radicular pain to be 0-1/10 for UE and LE, and pain to be 0-2/10 for neck and back, to improve ability for ADLs and IADLS.   Goal status: INITIAL    PLAN: PT FREQUENCY: 1-2x/week  PT DURATION: 8 weeks  PLANNED INTERVENTIONS: Therapeutic exercises, Therapeutic activity, Neuromuscular re-education, Patient/Family education, Self Care, Joint mobilization, Joint manipulation, Stair training, DME instructions, Aquatic Therapy, Dry Needling, Electrical stimulation, Cryotherapy, Moist heat, Taping, Ultrasound, Ionotophoresis 4mg /ml Dexamethasone , Manual therapy,  Vasopneumatic device, Traction, Spinal manipulation, Spinal mobilization,    PLAN FOR NEXT SESSION:  cervical traction for nerve pain,  work on R levator trigger point- very tender, R UT tender,  ER strength/t-band, (pain in s/l ) continue practice for quadruped .   Tinnie Don, PT, DPT 9:39 AM  05/16/24

## 2024-05-23 ENCOUNTER — Encounter: Admitting: Physical Therapy

## 2024-05-30 ENCOUNTER — Encounter: Admitting: Physical Therapy

## 2024-12-31 ENCOUNTER — Encounter: Admitting: Family Medicine

## 2025-03-06 ENCOUNTER — Encounter: Admitting: Family Medicine
